# Patient Record
Sex: Male | Born: 1975 | Race: White | Hispanic: No | Marital: Married | Smoking: Never smoker
Health system: Southern US, Community
[De-identification: ages and names within clinical notes are randomized; demographics above are authoritative.]

## PROBLEM LIST (undated history)

## (undated) DIAGNOSIS — E663 Overweight: Secondary | ICD-10-CM

## (undated) DIAGNOSIS — E119 Type 2 diabetes mellitus without complications: Secondary | ICD-10-CM

## (undated) DIAGNOSIS — I10 Essential (primary) hypertension: Secondary | ICD-10-CM

## (undated) DIAGNOSIS — E291 Testicular hypofunction: Secondary | ICD-10-CM

## (undated) DIAGNOSIS — E785 Hyperlipidemia, unspecified: Secondary | ICD-10-CM

## (undated) HISTORY — DX: Testicular hypofunction: E29.1

## (undated) HISTORY — PX: APPENDECTOMY: SHX54

## (undated) HISTORY — PX: CHOLECYSTECTOMY: SHX55

## (undated) HISTORY — PX: KNEE SURGERY: SHX244

## (undated) HISTORY — DX: Essential (primary) hypertension: I10

## (undated) HISTORY — DX: Overweight: E66.3

## (undated) HISTORY — DX: Type 2 diabetes mellitus without complications: E11.9

## (undated) HISTORY — DX: Hyperlipidemia, unspecified: E78.5

## (undated) HISTORY — PX: TONSILECTOMY, ADENOIDECTOMY, BILATERAL MYRINGOTOMY AND TUBES: SHX2538

## (undated) HISTORY — PX: TONSILLECTOMY: SUR1361

---

## 2018-11-17 ENCOUNTER — Ambulatory Visit: Payer: 59

## 2018-11-17 ENCOUNTER — Other Ambulatory Visit: Payer: Self-pay

## 2018-11-17 DIAGNOSIS — Z01818 Encounter for other preprocedural examination: Secondary | ICD-10-CM

## 2018-11-17 LAB — POCT URINALYSIS DIPSTICK
Bilirubin, UA: NEGATIVE
Blood, UA: NEGATIVE
Glucose, UA: POSITIVE — AB
Ketones, UA: NEGATIVE
Leukocytes, UA: NEGATIVE
Nitrite, UA: NEGATIVE
Protein, UA: NEGATIVE
Spec Grav, UA: >=1.03 — AB (ref 1.010–1.025)
Urobilinogen, UA: 0.2 E.U./dL
pH, UA: 5.5 (ref 5.0–8.0)

## 2018-11-17 NOTE — Progress Notes (Signed)
Covid test 11/11/18 at [facility]) - Negative

## 2018-11-18 LAB — CMP12+LP+TP+TSH+6AC+PSA+CBC…
ALT: 44 IU/L (ref 0–44)
AST: 20 IU/L (ref 0–40)
Albumin/Globulin Ratio: 1.7 (ref 1.2–2.2)
Albumin: 4.3 g/dL (ref 4.0–5.0)
Alkaline Phosphatase: 103 IU/L (ref 39–117)
BUN/Creatinine Ratio: 9 (ref 9–20)
BUN: 8 mg/dL (ref 6–24)
Basophils Absolute: 0.1 10*3/uL (ref 0.0–0.2)
Basos: 1 %
Bilirubin Total: 0.3 mg/dL (ref 0.0–1.2)
Calcium: 9.5 mg/dL (ref 8.7–10.2)
Chloride: 99 mmol/L (ref 96–106)
Chol/HDL Ratio: 2.7 ratio (ref 0.0–5.0)
Cholesterol, Total: 118 mg/dL (ref 100–199)
Creatinine, Ser: 0.92 mg/dL (ref 0.76–1.27)
EOS (ABSOLUTE): 0.5 10*3/uL — ABNORMAL HIGH (ref 0.0–0.4)
Eos: 5 %
Estimated CHD Risk: <0.5 times avg. (ref 0.0–1.0)
Free Thyroxine Index: 1.8 (ref 1.2–4.9)
GFR calc Af Amer: 116 mL/min/{1.73_m2} (ref >59)
GFR calc non Af Amer: 100 mL/min/{1.73_m2} (ref >59)
GGT: 43 IU/L (ref 0–65)
Globulin, Total: 2.6 g/dL (ref 1.5–4.5)
Glucose: 220 mg/dL — ABNORMAL HIGH (ref 65–99)
HDL: 44 mg/dL (ref >39)
Hematocrit: 48.9 % (ref 37.5–51.0)
Hemoglobin: 16.4 g/dL (ref 13.0–17.7)
Immature Grans (Abs): 0.1 10*3/uL (ref 0.0–0.1)
Immature Granulocytes: 1 %
Iron: 49 ug/dL (ref 38–169)
LDH: 162 IU/L (ref 121–224)
LDL Chol Calc (NIH): 56 mg/dL (ref 0–99)
Lymphocytes Absolute: 2.7 10*3/uL (ref 0.7–3.1)
Lymphs: 27 %
MCH: 28.4 pg (ref 26.6–33.0)
MCHC: 33.5 g/dL (ref 31.5–35.7)
MCV: 85 fL (ref 79–97)
Monocytes Absolute: 0.7 10*3/uL (ref 0.1–0.9)
Monocytes: 7 %
Neutrophils Absolute: 6 10*3/uL (ref 1.4–7.0)
Neutrophils: 59 %
Phosphorus: 3.2 mg/dL (ref 2.8–4.1)
Platelets: 311 10*3/uL (ref 150–450)
Potassium: 4.1 mmol/L (ref 3.5–5.2)
Prostate Specific Ag, Serum: 0.6 ng/mL (ref 0.0–4.0)
RBC: 5.77 x10E6/uL (ref 4.14–5.80)
RDW: 13.3 % (ref 11.6–15.4)
Sodium: 136 mmol/L (ref 134–144)
T3 Uptake Ratio: 29 % (ref 24–39)
T4, Total: 6.3 ug/dL (ref 4.5–12.0)
TSH: 1.25 u[IU]/mL (ref 0.450–4.500)
Total Protein: 6.9 g/dL (ref 6.0–8.5)
Triglycerides: 94 mg/dL (ref 0–149)
Uric Acid: 3.9 mg/dL (ref 3.7–8.6)
VLDL Cholesterol Cal: 18 mg/dL (ref 5–40)
WBC: 10 10*3/uL (ref 3.4–10.8)

## 2018-11-18 LAB — MICROALBUMIN / CREATININE URINE RATIO
Creatinine, Urine: 117.9 mg/dL
Microalb/Creat Ratio: 7 mg/g creat (ref 0–29)
Microalbumin, Urine: 7.7 ug/mL

## 2018-11-18 LAB — HGB A1C W/O EAG: Hgb A1c MFr Bld: 10 % — ABNORMAL HIGH (ref 4.8–5.6)

## 2018-11-26 ENCOUNTER — Encounter: Payer: Self-pay | Admitting: Internal Medicine

## 2018-11-26 ENCOUNTER — Ambulatory Visit: Payer: Self-pay | Admitting: Internal Medicine

## 2018-11-26 ENCOUNTER — Other Ambulatory Visit: Payer: Self-pay

## 2018-11-26 VITALS — BP 138/90 | HR 112 | Temp 98.8°F | Resp 16 | Ht 69.0 in | Wt 196.0 lb

## 2018-11-26 DIAGNOSIS — E1169 Type 2 diabetes mellitus with other specified complication: Secondary | ICD-10-CM

## 2018-11-26 DIAGNOSIS — E7849 Other hyperlipidemia: Secondary | ICD-10-CM

## 2018-11-26 DIAGNOSIS — R030 Elevated blood-pressure reading, without diagnosis of hypertension: Secondary | ICD-10-CM | POA: Insufficient documentation

## 2018-11-26 DIAGNOSIS — E663 Overweight: Secondary | ICD-10-CM | POA: Insufficient documentation

## 2018-11-26 DIAGNOSIS — E291 Testicular hypofunction: Secondary | ICD-10-CM | POA: Insufficient documentation

## 2018-11-26 DIAGNOSIS — R7989 Other specified abnormal findings of blood chemistry: Secondary | ICD-10-CM | POA: Insufficient documentation

## 2018-11-26 DIAGNOSIS — E119 Type 2 diabetes mellitus without complications: Secondary | ICD-10-CM | POA: Insufficient documentation

## 2018-11-26 MED ORDER — SITAGLIPTIN PHOSPHATE 50 MG PO TABS: 50.0000 mg | ORAL_TABLET | Freq: Every day | ORAL | 1 refills | Status: DC

## 2018-11-26 NOTE — Progress Notes (Signed)
Had a Covid test at [place] ([place]) on 11/18/2018 & it was negative.  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  Says they are testing every week.

## 2018-11-26 NOTE — Progress Notes (Signed)
S  - 43 y.o male who presents for annual physical evaluation.  He noted he has not felt very good in past couple months, more fatigued, sometimes exhausted, peeing more, up at least three times a night to urinate, more thirsty, and knows it is because his sugars are up. On checks at home when not feeling well, is 350-400 usually, with the lowest he has been being 220. He checks only when not feeling well, not routinely.   He denies any recent CP, palpitations, SOB, abdominal pains, change in bowel habits, dark/black stools, vision changes, recent fevers, or other Covid concerning sx's, no numbness or tingling in ext's, no LE swelling, no joint swelling.   Exercise - no regular exercise, now that gym opening up, he and his wife plan to get back to some exercise and encouraged Diet - not adherent to a very healthy diet  Allergies  Allergen Reactions  . Zithromax [Azithromycin] Dermatitis     Meds reviewed Current Outpatient Medications on File Prior to Visit  Medication Sig Dispense Refill  . atorvastatin (LIPITOR) 10 MG tablet Take 10 mg by mouth daily.    Marland Kitchen glipiZIDE (GLUCOTROL) 10 MG tablet Take 10 mg by mouth 2 (two) times daily.    . metFORMIN (GLUCOPHAGE) 1000 MG tablet Take 1,000 mg by mouth 2 (two) times daily.     No current facility-administered medications on file prior to visit.    He noted only taking the glipizide once daily  No tob use  Alcohol use - rare, once every 6 months noted  FH - non-contrbutory  O - NAD, masked  BP 138/90 (BP Location: Right Arm, Patient Position: Sitting, Cuff Size: Large)   Pulse (!) 112   Temp 98.8 F (37.1 C) (Oral)   Resp 16   Ht 5\' 9"  (1.753 m)   Wt 196 lb (88.9 kg)   SpO2 98%   BMI 28.94 kg/m   HEENT - sclera anicteric, PERRL, EOMI, conj - non-inj'ed, No sinus tenderness, TM's and canals clear, pharynx clear Neck - supple, no adenopathy, no TM, carotids 2+ and = without bruits bilat Car - RRR without m/g/r, HR approx 90 on my  exam, not tachy Pulm- CTA without wheeze or rales Abd - soft, mildly obese, NT, ND, BS+, no obvious HSM, no masses Back - no CVA tenderness Skin- no new lesions of concern on exposed areas, denied otherwise, + tattoos on back and ext's noted Ext - no LE edema, no active joints GU - no swelling in inguinal/suprapubic region, NT,  Neuro - affect was not flat, appropriate with conversation  Grossly non-focal with good strength on testing, sensation intact to LT in distal extremities, Romberg neg, no pronator drift, good balance on one foot, good finger to nose, good RAMs  Labs reviewed - 3+ glucose in urine, no ketones, glc - 220, LDL 56, PSA - 0.6, urine microalb ok, A1C - 10 ECG reviewed - NSR, no concerning changes from prior ECG  Ass/Plan: 1. NIDDM - very poorly controlled  Continue current medications and to increase the glipizide to bid from once daily Reviewed the other classes of medicines with him to help, and added sitagliptin - 50mg  daily and noted likely will need to increase this to 100mg  daily on f/u visit for better control On statin with goal to keep LDL <70 Importance of diet modifications emphasized Importance of some regular aerobic exercise encouraged Above to help with weight control/weight loss also important  Referral to nutrition to help  and he agreed with this  Discussed potential next steps if not better controlled and why important to keep sugars very well controlled to help protect the heart, kidneys, and lessen future complications. Needs routine eye examinations and recommended following up for regular assessments and importance, and he noted they usually won't see him unless sugars better controlled and will try to get better control before seeing them May need endocrine help in near future if not able to get better control of sugars in the very near futrue   2. Hyperlipidemia  statin to continue Importance of diet modifications and some regular aerobic  exercise encouraged Above to help with weight control/weight loss also very important  Goal is LDL < 70 noted  3. Borderline HTN concern/high BP for diabetic - BP's on prior checks in the past have been good (124/72 last year, 138/78 last visit in March).   Will monitor and do feel the stresses of poor control of sugars can contribute.  Will have a very low yield to add an ACE inhibitor in the near future (not want to add too many meds in one visit today, but likely add next visit a good option if BP still higher than desired)  4. Hypogonadism history - stopped testosterone injections in Sept 2019 and last check of labs had the testosterone level of 215 in 11/2017. May be contributing to his fatigue.  Rec a recheck of testosterone level with next lab draw in the am and get a free test with that.   5. Increased BMI - overweight with weight 209 in 2018, lost weight to 192 a year ago, and 196 today.   Importance of diet modifications and regular aerobic exercise emphasized Appropriate weight loss goals discussed with lifestyle changes adopted for the long term very important for success and nutrition consulted to help - lifestyle referral center  To schedule a f/u in 4 weeks and will need recheck of labs then, a urine dip, glucose and A1C in the office at a minimum, with testosterone and free testosterone levels to be obtained with next blood draw. F/U sooner prn  I did inform the patient that his f/u will be with another provider as I will not be available to see patients in this clinic after the end of this week.

## 2018-11-26 NOTE — Patient Instructions (Signed)
Increase glipizide to two times daily  Referral to nutrition initiated to help with diet changes/recommendations

## 2018-11-27 ENCOUNTER — Other Ambulatory Visit: Payer: Self-pay

## 2018-11-27 MED ORDER — ACCU-CHEK COMPACT PLUS CARE KIT: PACK | 0 refills | Status: DC

## 2018-11-27 MED ORDER — ACCU-CHEK COMPACT PLUS VI STRP: ORAL_STRIP | 12 refills | Status: DC

## 2018-12-24 ENCOUNTER — Ambulatory Visit: Payer: Self-pay

## 2018-12-29 ENCOUNTER — Other Ambulatory Visit: Payer: Self-pay

## 2018-12-29 ENCOUNTER — Encounter: Payer: Self-pay | Admitting: Occupational Medicine

## 2018-12-29 ENCOUNTER — Ambulatory Visit: Payer: Self-pay | Admitting: Occupational Medicine

## 2018-12-29 VITALS — BP 140/80 | HR 80 | Temp 98.2°F | Resp 16 | Ht 71.0 in | Wt 201.0 lb

## 2018-12-29 DIAGNOSIS — E119 Type 2 diabetes mellitus without complications: Secondary | ICD-10-CM

## 2018-12-29 DIAGNOSIS — I10 Essential (primary) hypertension: Secondary | ICD-10-CM

## 2018-12-29 LAB — POCT URINALYSIS DIPSTICK
Bilirubin, UA: NEGATIVE
Blood, UA: NEGATIVE
Glucose, UA: NEGATIVE
Ketones, UA: NEGATIVE
Leukocytes, UA: NEGATIVE
Nitrite, UA: NEGATIVE
Protein, UA: NEGATIVE
Spec Grav, UA: >=1.03 — AB (ref 1.010–1.025)
Urobilinogen, UA: 0.2 E.U./dL
pH, UA: 5 (ref 5.0–8.0)

## 2018-12-29 LAB — POCT GLYCOSYLATED HEMOGLOBIN (HGB A1C)
HbA1c POC (<> result, manual entry): 7.6 % (ref 4.0–5.6)
Hemoglobin A1C: 7.6 % — AB (ref 4.0–5.6)

## 2018-12-29 LAB — GLUCOSE, POCT (MANUAL RESULT ENTRY): POC Glucose: 110 mg/dl — AB (ref 70–99)

## 2018-12-29 MED ORDER — LISINOPRIL 5 MG PO TABS: 5.0000 mg | ORAL_TABLET | Freq: Every day | ORAL | 0 refills | Status: DC

## 2018-12-29 MED ORDER — ACCU-CHEK MULTICLIX LANCET DEV KIT: PACK | 3 refills | Status: DC

## 2018-12-29 NOTE — Progress Notes (Signed)
Last Covid test at [facility] [place] on 12/23/2018 & it was negative. Works 2nd job at [employer] home.  Last visit in office was 11/26/2018 with  Dr. Roxan Hockey for annual physical 1.  Glipizide increased to bid. 2.  Added sitaglipitn 50 mg qd & planned to      Increase dose to 100 mg at 1 month       Follow-up visit. 3.  Check urinalysis, glucose & A1c at        1 month visit. 4.  Nutrition Referral - States appt       Scheduled at [facility] 01/07/19.  States glucose at home this morning was 114. Needs Rx for lancets.  AMD

## 2018-12-29 NOTE — Progress Notes (Addendum)
Patient ID: Eric Carr DOB: August 22, 1975 AGE: 43 y.o. MRN: [id number]   PCP: No primary care provider on file.   Chief Complaint:  Chief Complaint  Patient presents with  . Follow-up    1 month Follow-up form 11/26/2018 Physical  . Covid Screening    Negative - See Notes.     Subjective:    HPI:  Eric Carr is a 43 y.o. male presents for evaluation  Chief Complaint  Patient presents with  . Follow-up    1 month Follow-up form 11/26/2018 Physical  . Covid Screening    Negative - See Notes.   43 year old male returns to [place] clinic, one month s/p last visit, for re-evaluation of DM2.  Patient seen at [place] [facility] on 11/26/2018; reported fatigue, increased urinary frequency, nocturia (at least three times a night), and polydipsia.  Labs reviewed. NIDDM poorly controlled. On 11/17/18; AIC 10 and RBG 220 (BUN/CR ok at 8 and 0.92 respectively). BP 138/90.  Increased glipizide from qd to bid. Advised continuation of Metformin 1031m bid. Added Sitagliptin 516mqd, with plan to increased to 10081md today.  Patient was previously on testosterone injections; discontinued Sept 2019. May be contributing to fatigue.  Today, patient reports significant symptom improvement. Reports increased urinary frequency, polyuria, nocturia, and polydipsia has completely resolved. States fatigue feels the same; suspects is due to rotating shifts (does 2 weeks of days, then 2 weeks of nights). Has talked to supervisor about changing schedule.  Patient denies fever, chills, headache, dizziness/lightheadedness, nausea/vomiting, chest pain, SOB, palpitations, peripheral edema, diarrhea, skin changes.  A limited review of symptoms was performed, pertinent positives and negatives as mentioned in HPI.  The following portions of the patient's history were reviewed and updated as appropriate: allergies, current medications and past medical history.  Patient Active  Problem List   Diagnosis Date Noted  . [place] ([place]/30/2020  . Overweight (BMI 25.0-29.9) 11/26/2018  . Hypogonadism in male 11/26/2018  . Elevated BP without diagnosis of hypertension 11/26/2018  . Other hyperlipidemia 11/26/2018    Allergies  Allergen Reactions  . Zithromax [Azithromycin] Dermatitis    Current Outpatient Medications on File Prior to Visit  Medication Sig Dispense Refill  . atorvastatin (LIPITOR) 10 MG tablet Take 10 mg by mouth daily.    . Blood Glucose Monitoring Suppl (ACCU-CHEK COMPACT CARE KIT) KIT Finger stick blood sugar check bid 1 kit 0  . glipiZIDE (GLUCOTROL) 10 MG tablet Take 10 mg by mouth 2 (two) times daily.    . gMarland Kitchenucose blood (ACCU-CHEK COMPACT PLUS) test strip Use as instructed 100 each 12  . metFORMIN (GLUCOPHAGE) 1000 MG tablet Take 1,000 mg by mouth 2 (two) times daily.    . sitaGLIPtin (JANUVIA) 50 MG tablet Take 1 tablet (50 mg total) by mouth daily. 90 tablet 1   No current facility-administered medications on file prior to visit.        Objective:   Vitals:   12/29/18 0832  BP: 140/80  Pulse: 80  Resp: 16  Temp: 98.2 F (36.8 C)  SpO2: 98%     Wt Readings from Last 3 Encounters:  12/29/18 201 lb (91.2 kg)  11/26/18 196 lb (88.9 kg)    Physical Exam:   General Appearance:  Patient sitting comfortably on examination table. Conversational. GooKermit Balolf-historian. In no acute distress. Afebrile.   Head:  Normocephalic, without obvious abnormality, atraumatic  Eyes:  PERRL, conjunctiva/corneas clear, EOM's intact  Neck: Supple, symmetrical, trachea midline, no adenopathy  Lungs:   Clear to auscultation bilaterally, respirations unlabored. Good aeration. No rales, rhonchi, crackles or wheezing.  Heart:  Regular rate and rhythm, S1 and S2 normal, no murmur, rub, or gallop  Abdomen:   Normal to inspection. Normoactive bowel sounds. No tenderness with palpation. No guarding, rigidity or rebound tenderness. No palpable  organomegaly.  Extremities: Extremities normal, atraumatic, no cyanosis or edema  Pulses: 2+ and symmetric  Skin: Skin color, texture, turgor normal, no rashes or lesions  Lymph nodes: Cervical, supraclavicular, and axillary nodes normal  Neurologic: Normal    Assessment & Plan:    Exam findings, diagnosis etiology and medication use and indications reviewed with patient. Follow-Up and discharge instructions provided. No emergent/urgent issues found on exam.  Patient education was provided.   Patient verbalized understanding of information provided and agrees with plan of care (POC), all questions answered. The patient is advised to call or return to clinic if condition does not see an improvement in symptoms, or to seek the care of the closest emergency department if condition worsens with the below plan.   Orders Placed This Encounter  Procedures  . POCT urinalysis dipstick  . POCT glycosylated hemoglobin (Hb A1C)  . POCT glucose (manual entry)    Results for orders placed or performed in visit on 12/29/18  POCT urinalysis dipstick  Result Value Ref Range   Color, UA Dark Yellow    Clarity, UA Clear    Glucose, UA Negative Negative   Bilirubin, UA Negative    Ketones, UA Negative    Spec Grav, UA >=1.030 (A) 1.010 - 1.025   Blood, UA Negative    pH, UA 5.0 5.0 - 8.0   Protein, UA Negative Negative   Urobilinogen, UA 0.2 0.2 or 1.0 E.U./dL   Nitrite, UA Negative    Leukocytes, UA Negative Negative   Appearance     Odor    POCT glycosylated hemoglobin (Hb A1C)  Result Value Ref Range   Hemoglobin A1C 7.6 (A) 4.0 - 5.6 %   HbA1c POC (<> result, manual entry) 7.6 4.0 - 5.6 %   HbA1c, POC (prediabetic range)     HbA1c, POC (controlled diabetic range)    POCT glucose (manual entry)  Result Value Ref Range   POC Glucose 110 (A) 70 - 99 mg/dl    1. Type 2 diabetes mellitus without complication, without long-term current use of insulin (HCC) - POCT urinalysis dipstick -  POCT glycosylated hemoglobin (Hb A1C) - POCT glucose (manual entry) - Lancets Misc. (ACCU-CHEK MULTICLIX LANCET DEV) KIT; Please provide patient with one box of lancets that match his Accu-Check glucometer, with 3 refills  Dispense: 1 kit; Refill: 3  2. Hypertension, unspecified type - lisinopril (ZESTRIL) 5 MG tablet; Take 1 tablet (5 mg total) by mouth at bedtime.  Dispense: 90 tablet; Refill: 0  Patient presented today for 1 month f/u in regards to worsening/uncontrolled and symptomatic DM2 (non-insulin dependent). Glipizide increased from 78m qd to 174mbid. Patient also started Sitagliptin (Januvia) 5033md. Plan was to increased Sitagliptin from 35m33m 100mg35mwever, patient's A1C went from 10 to 7.6 within one month. UA now with no glycosuria (still no ketones or protein). And, patient reports symptom resolution.  Plan is to have patient continue current DM2 medication regimen. Provided refill of lancets. Due to 8 year history of DM2 (diagnosed in 2012 with initial A1C of 13) and borderline hypertension, prescribed Lisinopril 5mg q56m   Patient will f/u in 2-3 months. At that  time, will repeat blood work (including BMP, A1C, testosterone and free testosterone).  Patient agrees with plan.   Darlin Priestly, MHS, PA-C Montey Hora, MHS, PA-C Advanced Practice Provider [place] [email or web address]

## 2019-01-07 ENCOUNTER — Other Ambulatory Visit: Payer: Self-pay

## 2019-01-07 ENCOUNTER — Encounter: Payer: Self-pay | Admitting: Dietician

## 2019-01-07 ENCOUNTER — Encounter: Payer: 59 | Attending: Internal Medicine | Admitting: Dietician

## 2019-01-07 VITALS — Ht 69.0 in | Wt 198.1 lb

## 2019-01-07 DIAGNOSIS — E119 Type 2 diabetes mellitus without complications: Secondary | ICD-10-CM

## 2019-01-07 DIAGNOSIS — Z6829 Body mass index (BMI) 29.0-29.9, adult: Secondary | ICD-10-CM | POA: Diagnosis not present

## 2019-01-07 DIAGNOSIS — Z713 Dietary counseling and surveillance: Secondary | ICD-10-CM | POA: Diagnosis not present

## 2019-01-07 DIAGNOSIS — E1165 Type 2 diabetes mellitus with hyperglycemia: Secondary | ICD-10-CM | POA: Diagnosis not present

## 2019-01-07 DIAGNOSIS — E663 Overweight: Secondary | ICD-10-CM

## 2019-01-07 NOTE — Progress Notes (Signed)
Medical Nutrition Therapy: Visit start time: 0900  end time: 1010  Assessment:  Diagnosis: Type 2 diabetes, overweight Past medical history: HTN Psychosocial issues/ stress concerns: none  Preferred learning method:  . Auditory . Hands-on   Current weight: 198.1lbs Height: 5'11" Medications, supplements: reconciled list in medical record  Progress and evaluation:   Patient reports recent improvement in BG control; 115-135 fasting BGs. Tests once daily. He states BGs were 400-500 prior to starting Januvia.  He does not tolerate artificial sweeteners, gets headaches from aspartame, saccharin, and sucralose.   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   Denies any recent diet changes. Denies any previous diabetes education.   Physical activity: no structured exercise; works active job for 12-hour shifts, alternating days and nights every 2 weeks  Dietary Intake:  Usual eating pattern includes 1-2 meals and 0 snacks per day. Dining out frequency: 3-6 meals per week.  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 --  Snack: none Beverages: sweet or unsweetened tea; water; some soda (diet sodas = headache)  Nutrition Care Education: Topics covered:  Basic nutrition: basic food groups, appropriate nutrient balance, appropriate meal and snack schedule, general nutrition guidelines    Weight control:determining reasonable weight loss rate, importance of low sugar and low fat choices, portion control strategies including increasing low-carb vegetables to help with  satiety and planning for more frequent meals rather than 1-2 large meals, estimated energy needs for weight loss at 1600-1700kcal, provided guidance for 45%CHO,  Advanced nutrition:  recipe modification, cooking techniques, dining out, food label reading Diabetes:  goals for BGs, appropriate meal and snack schedule, appropriate carb intake and balance, healthy carb choices, role of fiber, protein, fat; sample balanced meals Hypertension: identifying high sodium foods, identifying food sources of potassium, magnesium Hyperlipidemia:  role of fiber, limiting excess fat/ saturated fat   Nutritional Diagnosis:  [place]-2.2 Altered nutrition-related laboratory As related to Type 2 diabetes.  As evidenced by patient with recent HbA1C of 10%. [place]-3.3 Overweight/obesity As related to excess calories.  As evidenced by patient with current BMI of 29.25.  Intervention:   Instruction and discussion as noted above.  Established nutrition goals with direction from patient.   Patient voices readiness to work on dietary improvement for his health.   Education Materials given:  . General diet guidelines for Diabetes . Plate Planner with food lists, sample meal pattern . Sample menus . Goals/ instructions   Learner/ who was taught:  . Patient   Level of understanding: Marland Kitchen Verbalizes/ demonstrates competency  Demonstrated degree of understanding via:   Teach back Learning barriers: . None  Willingness to learn/ readiness for change: . Acceptance, ready for change   Monitoring and Evaluation:  Dietary intake, exercise, BG control, and body weight      follow up: 02/13/19 at 9:00am

## 2019-01-07 NOTE — Patient Instructions (Addendum)
   Reduce the amount of soda each day, remember every 4oz is another serving of carb (about 15grams) and 60 calories.   Control the amount of carb within each meal to 45-60grams. Decrease portions as needed to control intake and keep blood sugar steady. Having a protein food and a generous portion of vegetables helps with feeling full.   Plan to eat something every 3-5 hours while awake, especially when active. Consider adding a light meal or a snack during night shift.

## 2019-02-13 ENCOUNTER — Ambulatory Visit: Payer: 59 | Admitting: Dietician

## 2019-03-02 DIAGNOSIS — Z03818 Encounter for observation for suspected exposure to other biological agents ruled out: Secondary | ICD-10-CM | POA: Diagnosis not present

## 2019-03-19 ENCOUNTER — Encounter: Payer: Self-pay | Admitting: Dietician

## 2019-03-19 NOTE — Progress Notes (Signed)
Have not heard back from patient to reschedule his missed appointment from 02/13/19. Sent notification to referring provider.

## 2019-04-02 ENCOUNTER — Other Ambulatory Visit: Payer: Self-pay

## 2019-04-02 DIAGNOSIS — I10 Essential (primary) hypertension: Secondary | ICD-10-CM

## 2019-04-02 DIAGNOSIS — E291 Testicular hypofunction: Secondary | ICD-10-CM

## 2019-04-02 NOTE — Progress Notes (Signed)
Patient comes in today for 3 month follow up labs.

## 2019-04-05 LAB — BASIC METABOLIC PANEL
BUN/Creatinine Ratio: 14 (ref 9–20)
BUN: 12 mg/dL (ref 6–24)
CO2: 19 mmol/L — ABNORMAL LOW (ref 20–29)
Calcium: 8.7 mg/dL (ref 8.7–10.2)
Chloride: 105 mmol/L (ref 96–106)
Creatinine, Ser: 0.87 mg/dL (ref 0.76–1.27)
GFR calc Af Amer: 120 mL/min/{1.73_m2} (ref >59)
GFR calc non Af Amer: 104 mL/min/{1.73_m2} (ref >59)
Glucose: 185 mg/dL — ABNORMAL HIGH (ref 65–99)
Potassium: 4.2 mmol/L (ref 3.5–5.2)
Sodium: 138 mmol/L (ref 134–144)

## 2019-04-05 LAB — TESTOSTERONE,FREE AND TOTAL
Testosterone, Free: 9.1 pg/mL (ref 6.8–21.5)
Testosterone: 240 ng/dL — ABNORMAL LOW (ref 264–916)

## 2019-04-05 LAB — HGB A1C W/O EAG: Hgb A1c MFr Bld: 10 % — ABNORMAL HIGH (ref 4.8–5.6)

## 2019-04-09 ENCOUNTER — Ambulatory Visit: Payer: Self-pay

## 2019-04-23 ENCOUNTER — Other Ambulatory Visit: Payer: Self-pay

## 2019-04-23 ENCOUNTER — Ambulatory Visit: Payer: Self-pay | Admitting: Physician Assistant

## 2019-04-23 VITALS — BP 138/80 | HR 92 | Temp 98.2°F | Ht 69.0 in | Wt 199.6 lb

## 2019-04-23 DIAGNOSIS — E119 Type 2 diabetes mellitus without complications: Secondary | ICD-10-CM

## 2019-04-23 NOTE — Progress Notes (Signed)
One month follow up for DM2, so now 2 months from increased intervention   States he is following a diet and taking meds as directed He has not lost any weight  Expresses frustration that AM FBS, when he does check  is frequently around 300  Went to one Diabetic education session at [facility] and expresses dissatisfaction with the interaction, so did not return for f/u visit  11/17/18  Recent A1C     10.0   After new start medications:  12/29/18   A1C   7.6  States that he is taking medications as directed: 04/01/18      A1C 10                   Today 10  Meds in computer reviewed with patient: Metformin 1000 mg BID Glipizide   10 mg daily BID Sitagliptin/Januvia 50 mg 1 daily ( did not increase to BID)  Aorvastatin 10 mg daily Lisinopril 5 mg  1 QHS  Issues of nocturia , polyuria , polydipsia are generally improved but have returned slightly with decreased control Denies fever or chlls, faint/lightheaded/dizzy  Denies ever being told that he was told to  exercise daily  Impression : DM2   Poor control  Plan : Extended consultation with patient regarding All of above issues. He experiences frustration with elevated FBS levels -"the medications aren't working". Discussed the ability to "out eat" the control levels of meds Has not yet taken ownership of the chronicity of this diagnosis and is coached to reconsider returning to diabetic /nutrition  counseling. Walk 30 minutes per day, briskly and increase time whenever possible. Given handouts and encouraged to read and take portion measurements and  Food choices seriously. Supported and encouraged.  and he will see improvements over time. Will try to see regularly To support. Rec he increase Januvia to BID as above  See in one month for DM review, wt, BP, A1C

## 2019-04-23 NOTE — Patient Instructions (Signed)

## 2019-05-18 ENCOUNTER — Ambulatory Visit: Payer: Self-pay

## 2019-07-21 ENCOUNTER — Other Ambulatory Visit: Payer: Self-pay

## 2019-07-21 ENCOUNTER — Ambulatory Visit: Payer: 59

## 2019-07-21 ENCOUNTER — Encounter: Payer: Self-pay | Admitting: Emergency Medicine

## 2019-07-21 ENCOUNTER — Ambulatory Visit: Payer: Self-pay | Admitting: Emergency Medicine

## 2019-07-21 VITALS — BP 142/93 | HR 80 | Temp 97.6°F | Resp 12 | Ht 69.0 in | Wt 193.0 lb

## 2019-07-21 DIAGNOSIS — M25512 Pain in left shoulder: Secondary | ICD-10-CM

## 2019-07-21 NOTE — Addendum Note (Signed)
Addended by: Gardner Candle on: 07/21/2019 11:54 AM   Modules accepted: Orders

## 2019-07-21 NOTE — Progress Notes (Signed)
No Known Injury  Shoulder pain x couple of months. Intermittent Gradually getting worse Hurts with certain movements & positions. Feels like a sharp stabbing pain.  Has taken [facility] powder & doesn't get much relief with it. Has tried Unker's ointment - no relief.  AMD

## 2019-07-21 NOTE — Progress Notes (Signed)
  [facility] Occupational Health Provider Note       Time seen: 11:05 AM    I have reviewed the vital signs and the nursing notes.  HISTORY   Chief Complaint Shoulder Pain (Left)    HPI Luchiano Viscomi is a 44 y.o. male with a history of diabetes who presents today for left shoulder pain.  Patient's had ongoing pain for several months that is gradually gotten worse.  Range of motion is limited in the left shoulder because of pain.  Has taken BC powder but does not get significant relief with it.  He has not had any specific injury that he remembers.  Past Medical History:  Diagnosis Date  . Diabetes (HCC)   . Hypogonadism male   . Overweight     Past Surgical History:  Procedure Laterality Date  . APPENDECTOMY    . CHOLECYSTECTOMY    . KNEE SURGERY Left   . TONSILECTOMY, ADENOIDECTOMY, BILATERAL MYRINGOTOMY AND TUBES Bilateral     Allergies Zithromax [azithromycin]  Review of Systems Constitutional: Negative for fever. Musculoskeletal: Positive for left shoulder pain and decreased range of motion Skin: Negative for rash. Neurological: Negative for headaches, focal weakness or numbness.  All systems negative/normal/unremarkable except as stated in the HPI  ____________________________________________   PHYSICAL EXAM:  VITAL SIGNS: Vitals:   07/21/19 1053  BP: (!) 142/93  Pulse: 80  Resp: 12  Temp: 97.6 F (36.4 C)  SpO2: 98%    Constitutional: Alert and oriented. Well appearing and in no distress. Eyes: Conjunctivae are normal. Normal extraocular movements. Musculoskeletal: There is pain with range of motion of left shoulder.  Particular pain in the left shoulder with both external and internal rotation.  Findings concerning for rotator cuff injury.  He has severely limited range of motion with internal rotation and abduction.  External rotation is limited as well. Neurologic:  Normal speech and language. No gross focal neurologic deficits are  appreciated.  Skin:  Skin is warm, dry and intact. No rash noted. Psychiatric: Speech and behavior are normal.   DIFFERENTIAL DIAGNOSIS  Rotator cuff injury, arthritis, bursitis  ASSESSMENT AND PLAN  Left shoulder pain   Plan: The patient had presented for left shoulder pain mostly resembling rotator cuff injury.  He is encouraged to use anti-inflammatory medicine.  Light duty, no heavy lifting for the next week and follow-up with orthopedics for MRI of his left shoulder.  Daryel November MD    Note: This note was generated in part or whole with voice recognition software. Voice recognition is usually quite accurate but there are transcription errors that can and very often do occur. I apologize for any typographical errors that were not detected and corrected.

## 2019-07-31 DIAGNOSIS — M7540 Impingement syndrome of unspecified shoulder: Secondary | ICD-10-CM | POA: Insufficient documentation

## 2019-08-13 ENCOUNTER — Other Ambulatory Visit: Payer: Self-pay

## 2019-08-13 ENCOUNTER — Encounter: Payer: Self-pay | Admitting: Physician Assistant

## 2019-08-13 ENCOUNTER — Ambulatory Visit: Payer: Self-pay | Admitting: Physician Assistant

## 2019-08-13 VITALS — BP 148/86 | HR 101 | Temp 97.5°F | Resp 12 | Ht 69.0 in | Wt 193.0 lb

## 2019-08-13 DIAGNOSIS — E1169 Type 2 diabetes mellitus with other specified complication: Secondary | ICD-10-CM

## 2019-08-13 LAB — POCT GLYCOSYLATED HEMOGLOBIN (HGB A1C): Hemoglobin A1C: 11.3 % — AB (ref 4.0–5.6)

## 2019-08-13 MED ORDER — GLIPIZIDE 10 MG PO TABS: 10.0000 mg | ORAL_TABLET | Freq: Two times a day (BID) | ORAL | 3 refills | Status: DC

## 2019-08-13 MED ORDER — LISINOPRIL 10 MG PO TABS: 10.0000 mg | ORAL_TABLET | Freq: Every day | ORAL | 3 refills | Status: DC

## 2019-08-13 MED ORDER — ATORVASTATIN CALCIUM 10 MG PO TABS: 10.0000 mg | ORAL_TABLET | Freq: Every day | ORAL | 3 refills | Status: DC

## 2019-08-13 MED ORDER — METFORMIN HCL 1000 MG PO TABS: 1000.0000 mg | ORAL_TABLET | Freq: Two times a day (BID) | ORAL | 3 refills | Status: DC

## 2019-08-13 MED ORDER — SITAGLIPTIN PHOSPHATE 50 MG PO TABS: 50.0000 mg | ORAL_TABLET | Freq: Every day | ORAL | 3 refills | Status: DC

## 2019-08-13 MED ORDER — SITAGLIPTIN PHOSPHATE 50 MG PO TABS: 50.0000 mg | ORAL_TABLET | Freq: Every day | ORAL | 1 refills | Status: DC

## 2019-08-13 NOTE — Progress Notes (Signed)
Last DM visit was 04/23/19 with Earvin Hansen, PA-C. Last physical was 11/26/2018 with  Dr. Dorris Fetch.  Is taking Meloxicam & Flexeril prescribed by [facility] for Left Shoulder bone spurs.  States Rx's expired 3 wks ago & hasn't called for refills.  Needs Rs refills for: Atovastain Metformin Lisinopril Glipizide  Januvia  AMD

## 2019-08-13 NOTE — Progress Notes (Signed)
   Subjective: Diabetes    Patient ID: Eric Carr, male    DOB: 11-22-1975, 44 y.o.   MRN: [id number]  HPI Patient presents for reevaluation of diabetes.  Patient has been out of medication for 3 months.   Review of Systems Diabetes, hyperlipidemia, and hypertension.    Objective:   Physical Exam No acute distress.  HEENT is unremarkable.  Neck is supple without adenopathy or bruits.  Lungs are clear to auscultation.  Heart regular rate and rhythm.  Abdomen negative HSM, normoactive bowel sound, soft, and nontender to palpation.       Assessment & Plan: Diabetes  Patient diabetes poor control.  Hemoglobin A1c today was 11.3.  4 months ago was 10.0.  Patient advised to restart medications and to keep a diabetic log and follow-up in 1 month.  All medications were refilled today.

## 2019-09-10 ENCOUNTER — Ambulatory Visit: Payer: Self-pay | Admitting: Physician Assistant

## 2019-09-10 ENCOUNTER — Other Ambulatory Visit: Payer: Self-pay

## 2019-09-10 ENCOUNTER — Encounter: Payer: Self-pay | Admitting: Physician Assistant

## 2019-09-10 VITALS — BP 136/86 | HR 80 | Temp 97.5°F | Resp 14 | Ht 69.0 in | Wt 194.0 lb

## 2019-09-10 DIAGNOSIS — E1169 Type 2 diabetes mellitus with other specified complication: Secondary | ICD-10-CM

## 2019-09-10 NOTE — Progress Notes (Signed)
   Subjective: Diabetes    Patient ID: Eric Carr, male    DOB: [birth year], 44 y.o.   MRN: [id number]  HPI Patient presents for 1 month follow-up secondary to poor control diabetes.  Patient is currently taking Glucotrol, Metformin, and Januvia.  Patient is keeping a log and showed an average of glucose of 350.  Patient states due to working shifts he did not always take the medicine on a routine basis.  Patient continues to have polydipsia, nocturia, and fatigue. Review of Systems Diabetes, hyperlipidemia, and hypertension.    Objective:   Physical Exam No acute distress.  Exam is deferred.  Unable to get a hemoglobin A1c by fingerstick due to unit not working properly.       Assessment & Plan: Diabetes  Patient diabetes still poorly controlled.  We will get a hemoglobin A1c and will have the patient consulted to endocrinology.

## 2019-09-11 LAB — HGB A1C W/O EAG: Hgb A1c MFr Bld: 11.3 % — ABNORMAL HIGH (ref 4.8–5.6)

## 2019-09-17 NOTE — Addendum Note (Signed)
Addended by: Gardner Candle on: 09/17/2019 01:15 PM   Modules accepted: Orders

## 2019-09-30 ENCOUNTER — Other Ambulatory Visit: Payer: Self-pay

## 2019-09-30 DIAGNOSIS — Z20822 Contact with and (suspected) exposure to covid-19: Secondary | ICD-10-CM

## 2019-09-30 NOTE — Progress Notes (Signed)
Presents for covid screen.  S/Sx started yesterday: Headache  Bodyaches Fever of around 100 F Head congestion  Denies loss of taste or smell.  AMD

## 2019-10-02 LAB — NOVEL CORONAVIRUS, NAA: SARS-CoV-2, NAA: DETECTED — AB

## 2019-10-02 LAB — SARS-COV-2, NAA 2 DAY TAT

## 2019-10-30 ENCOUNTER — Other Ambulatory Visit: Payer: Self-pay

## 2019-10-30 ENCOUNTER — Emergency Department
Admission: EM | Admit: 2019-10-30 | Discharge: 2019-10-30 | Disposition: A | Discharge status: Home / Self Care | Payer: 59 | Attending: Emergency Medicine | Admitting: Emergency Medicine

## 2019-10-30 DIAGNOSIS — Z79899 Other long term (current) drug therapy: Secondary | ICD-10-CM | POA: Diagnosis not present

## 2019-10-30 DIAGNOSIS — Y999 Unspecified external cause status: Secondary | ICD-10-CM | POA: Diagnosis not present

## 2019-10-30 DIAGNOSIS — Y92410 Unspecified street and highway as the place of occurrence of the external cause: Secondary | ICD-10-CM | POA: Insufficient documentation

## 2019-10-30 DIAGNOSIS — M545 Low back pain: Secondary | ICD-10-CM | POA: Diagnosis not present

## 2019-10-30 DIAGNOSIS — M7918 Myalgia, other site: Secondary | ICD-10-CM

## 2019-10-30 DIAGNOSIS — Z7984 Long term (current) use of oral hypoglycemic drugs: Secondary | ICD-10-CM | POA: Insufficient documentation

## 2019-10-30 DIAGNOSIS — Y939 Activity, unspecified: Secondary | ICD-10-CM | POA: Diagnosis not present

## 2019-10-30 DIAGNOSIS — E119 Type 2 diabetes mellitus without complications: Secondary | ICD-10-CM | POA: Diagnosis not present

## 2019-10-30 DIAGNOSIS — M25512 Pain in left shoulder: Secondary | ICD-10-CM | POA: Insufficient documentation
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
## 2020-02-12 ENCOUNTER — Other Ambulatory Visit: Payer: Self-pay | Admitting: Emergency Medicine

## 2020-02-12 ENCOUNTER — Other Ambulatory Visit: Payer: Self-pay

## 2020-02-12 ENCOUNTER — Emergency Department
Admission: EM | Admit: 2020-02-12 | Discharge: 2020-02-12 | Disposition: A | Discharge status: Home / Self Care | Payer: 59 | Attending: Emergency Medicine | Admitting: Emergency Medicine

## 2020-02-12 ENCOUNTER — Emergency Department: Payer: 59

## 2020-02-12 ENCOUNTER — Encounter: Payer: Self-pay | Admitting: Emergency Medicine

## 2020-02-12 DIAGNOSIS — Z7984 Long term (current) use of oral hypoglycemic drugs: Secondary | ICD-10-CM | POA: Insufficient documentation

## 2020-02-12 DIAGNOSIS — R0781 Pleurodynia: Secondary | ICD-10-CM | POA: Diagnosis not present

## 2020-02-12 DIAGNOSIS — R059 Cough, unspecified: Secondary | ICD-10-CM | POA: Diagnosis not present

## 2020-02-12 DIAGNOSIS — E119 Type 2 diabetes mellitus without complications: Secondary | ICD-10-CM | POA: Insufficient documentation

## 2020-02-12 DIAGNOSIS — Z8719 Personal history of other diseases of the digestive system: Secondary | ICD-10-CM

## 2020-02-12 DIAGNOSIS — R072 Precordial pain: Secondary | ICD-10-CM | POA: Diagnosis present

## 2020-02-12 DIAGNOSIS — R0789 Other chest pain: Secondary | ICD-10-CM | POA: Diagnosis not present

## 2020-02-12 LAB — CBC
HCT: 45.8 % (ref 39.0–52.0)
Hemoglobin: 16.1 g/dL (ref 13.0–17.0)
MCH: 28.9 pg (ref 26.0–34.0)
MCHC: 35.2 g/dL (ref 30.0–36.0)
MCV: 82.1 fL (ref 80.0–100.0)
Platelets: 271 10*3/uL (ref 150–400)
RBC: 5.58 MIL/uL (ref 4.22–5.81)
RDW: 12.7 % (ref 11.5–15.5)
WBC: 10.5 10*3/uL (ref 4.0–10.5)
nRBC: 0 % (ref 0.0–0.2)

## 2020-02-12 LAB — BASIC METABOLIC PANEL
Anion gap: 11 (ref 5–15)
BUN: 13 mg/dL (ref 6–20)
CO2: 22 mmol/L (ref 22–32)
Calcium: 9 mg/dL (ref 8.9–10.3)
Chloride: 104 mmol/L (ref 98–111)
Creatinine, Ser: 0.82 mg/dL (ref 0.61–1.24)
GFR, Estimated: >60 mL/min (ref >60)
Glucose, Bld: 267 mg/dL — ABNORMAL HIGH (ref 70–99)
Potassium: 4 mmol/L (ref 3.5–5.1)
Sodium: 137 mmol/L (ref 135–145)

## 2020-02-12 LAB — TROPONIN I (HIGH SENSITIVITY): Troponin I (High Sensitivity): 3 ng/L (ref <18)

## 2020-02-12 MED ORDER — FAMOTIDINE 40 MG PO TABS: 40.0000 mg | ORAL_TABLET | Freq: Every evening | ORAL | 1 refills | Status: DC

## 2020-02-12 NOTE — ED Provider Notes (Signed)
[facility] Emergency Department Provider Note   ____________________________________________   Event Date/Time   First MD Initiated Contact with Patient 02/12/20 1156     (approximate)  I have reviewed the triage vital signs and the nursing notes.   HISTORY  Chief Complaint Chest Pain    HPI Eric Carr is a 44 y.o. male stated past medical history of diabetes and GERD who presents for upper sternal burning chest pain that began this morning.  Patient states that this pain is burning, upper central substernal region, 6/10, nonradiating, and worse with deep inspiration.  Patient denies any relieving factors.  Patient states that he has had similar pains in the past but it has never been this severe or lasted this long.  Patient denies using any medications for his reflux and states that he commonly wakes up early in the morning with a sore throat and metallic taste.  Patient currently denies any vision changes, tinnitus, difficulty speaking, facial droop, sore throat, shortness of breath, abdominal pain, nausea/vomiting/diarrhea, dysuria, or weakness/numbness/paresthesias in any extremity         Past Medical History:  Diagnosis Date  . Diabetes ([place])   . Hypogonadism male   . Overweight     Patient Active Problem List   Diagnosis Date Noted  . Diabetes ([name]) 11/26/2018  . Overweight (BMI 25.0-29.9) 11/26/2018  . Hypogonadism in male 11/26/2018  . Elevated BP without diagnosis of hypertension 11/26/2018  . Other hyperlipidemia 11/26/2018    Past Surgical History:  Procedure Laterality Date  . APPENDECTOMY    . CHOLECYSTECTOMY    . KNEE SURGERY Left   . TONSILECTOMY, ADENOIDECTOMY, BILATERAL MYRINGOTOMY AND TUBES Bilateral     Prior to Admission medications   Medication Sig Start Date End Date Taking? Authorizing Provider  Accu-Chek FastClix Lancets MISC USE 1 TO CHECK GLUCOSE ONCE DAILY 12/29/18   [provider]  atorvastatin  (LIPITOR) 10 MG tablet Take 1 tablet (10 mg total) by mouth daily. 08/13/19   Sable Feil, PA-C  Blood Glucose Monitoring Suppl (ACCU-CHEK COMPACT CARE KIT) KIT Finger stick blood sugar check bid 11/27/18   Towanda Malkin, MD  cyclobenzaprine (FLEXERIL) 10 MG tablet Take 10 mg by mouth at bedtime. 07/31/19   [provider]  famotidine (PEPCID) 40 MG tablet Take 1 tablet (40 mg total) by mouth every evening. 02/12/20 02/11/21  Lavonia Drafts, MD  glipiZIDE (GLUCOTROL) 10 MG tablet Take 1 tablet (10 mg total) by mouth 2 (two) times daily before a meal. 08/13/19   Sable Feil, PA-C  glipiZIDE (GLUCOTROL) 10 MG tablet Take 1 tablet (10 mg total) by mouth 2 (two) times daily. 08/13/19   Sable Feil, PA-C  glucose blood (ACCU-CHEK COMPACT PLUS) test strip Use as instructed 11/27/18   Towanda Malkin, MD  lisinopril (ZESTRIL) 10 MG tablet Take 1 tablet (10 mg total) by mouth daily. 08/13/19   Sable Feil, PA-C  Melatonin 10 MG TABS Take by mouth.    [provider]  meloxicam (MOBIC) 15 MG tablet Take 15 mg by mouth daily. 07/31/19   [provider]  metFORMIN (GLUCOPHAGE) 1000 MG tablet Take 1 tablet (1,000 mg total) by mouth 2 (two) times daily. 08/13/19   Sable Feil, PA-C  sitaGLIPtin (JANUVIA) 50 MG tablet Take 1 tablet (50 mg total) by mouth daily. Patient taking differently: Take 50 mg by mouth daily. Twice a day 08/13/19   Sable Feil, PA-C  sitaGLIPtin [facility])  50 MG tablet Take 1 tablet (50 mg total) by mouth daily. 08/13/19   Sable Feil, PA-C    Allergies Zithromax [azithromycin]  History reviewed. No pertinent family history.  Social History Social History   Tobacco Use  . Smoking status: Never Smoker  . Smokeless tobacco: Never Used  Substance Use Topics  . Alcohol use: Not Currently    Comment: 1-2 drinks per year on average    Review of Systems Constitutional: No fever/chills Eyes: No visual changes. ENT: No sore  throat. Cardiovascular: Endorses chest pain. Respiratory: Denies shortness of breath. Gastrointestinal: No abdominal pain.  No nausea, no vomiting.  No diarrhea. Genitourinary: Negative for dysuria. Musculoskeletal: Negative for acute arthralgias Skin: Negative for rash. Neurological: Negative for headaches, weakness/numbness/paresthesias in any extremity Psychiatric: Negative for suicidal ideation/homicidal ideation   ____________________________________________   PHYSICAL EXAM:  VITAL SIGNS: ED Triage Vitals  Enc Vitals Group     BP 02/12/20 0913 138/86     Pulse Rate 02/12/20 0913 87     Resp 02/12/20 0913 20     Temp 02/12/20 0913 98.6 F (37 C)     Temp Source 02/12/20 0913 Oral     SpO2 02/12/20 0913 97 %     Weight 02/12/20 0915 188 lb (85.3 kg)     Height 02/12/20 0915 '5\' 9"'  (1.753 m)     Head Circumference --      Peak Flow --      Pain Score 02/12/20 0915 4     Pain Loc --      Pain Edu? --      Excl. in [place]? --    Constitutional: Alert and oriented. Well appearing and in no acute distress. Eyes: Conjunctivae are normal. PERRL. Head: Atraumatic. Nose: No congestion/rhinnorhea. Mouth/Throat: Mucous membranes are moist. Neck: No stridor Cardiovascular: Grossly normal heart sounds.  Good peripheral circulation. Respiratory: Normal respiratory effort.  No retractions. Gastrointestinal: Soft and nontender. No distention. Musculoskeletal: No obvious deformities Neurologic:  Normal speech and language. No gross focal neurologic deficits are appreciated. Skin:  Skin is warm and dry. No rash noted. Psychiatric: Mood and affect are normal. Speech and behavior are normal.  ____________________________________________   LABS (all labs ordered are listed, but only abnormal results are displayed)  Labs Reviewed  BASIC METABOLIC PANEL - Abnormal; Notable for the following components:      Result Value   Glucose, Bld 267 (*)    All other components within normal  limits  CBC  TROPONIN I (HIGH SENSITIVITY)  TROPONIN I (HIGH SENSITIVITY)   ____________________________________________  EKG  ED ECG REPORT I, Naaman Plummer, the attending physician, personally viewed and interpreted this ECG.  Date: 02/12/2020 EKG Time: 0905 Rate: 87 Rhythm: normal sinus rhythm QRS Axis: normal Intervals: normal ST/T Wave abnormalities: normal Narrative Interpretation: no evidence of acute ischemia  ____________________________________________  RADIOLOGY  ED MD interpretation: Two view x-ray of the chest shows no evidence of acute abnormalities including no pneumonia, pneumothorax, or widened mediastinum  Official radiology report(s): DG Chest 2 View  Result Date: 02/12/2020 CLINICAL DATA:  Cough and chest pressure EXAM: CHEST - 2 VIEW COMPARISON:  None. FINDINGS: Lungs are clear. Heart size and pulmonary vascularity are normal. No adenopathy. No pneumothorax. No bone lesions. IMPRESSION: Lungs clear.  Cardiac silhouette normal. Electronically Signed   By: Lowella Grip III M.D.   On: 02/12/2020 09:41    ____________________________________________   PROCEDURES  Procedure(s) performed (including Critical Care):  .1-3 Lead EKG Interpretation  Performed by: Naaman Plummer, MD Authorized by: Naaman Plummer, MD     Interpretation: normal     ECG rate:  88   ECG rate assessment: normal     Rhythm: sinus rhythm     Ectopy: none     Conduction: normal       ____________________________________________   INITIAL IMPRESSION / ASSESSMENT AND PLAN / ED COURSE  As part of my medical decision making, I reviewed the following data within the [place] notes reviewed and incorporated, Labs reviewed, EKG interpreted, Old chart reviewed, Radiograph reviewed and Notes from prior ED visits reviewed and incorporated        Workup: ECG, CXR, CBC, BMP, Troponin Findings: ECG: No overt evidence of STEMI. No evidence of  Brugadas sign, delta wave, epsilon wave, significantly prolonged QTc, or malignant arrhythmia HS Troponin: Negative x1 Other Labs unremarkable for emergent problems. CXR: Without PTX, PNA, or widened mediastinum Last Stress Test: Never Last Heart Catheterization: Never HEART Score: 1  Given History, Exam, and Workup I have low suspicion for ACS, Pneumothorax, Pneumonia, Pulmonary Embolus, Tamponade, Aortic Dissection or other emergent problem as a cause for this presentation.   Reassesment: Prior to discharge patients pain was controlled and they were well appearing.  Disposition:  Discharge. Strict return precautions discussed with patient with full understanding. Advised patient to follow up promptly with primary care provider   Clinical Course as of 02/12/20 1239  Fri Feb 12, 2020  1238 EKG 12-Lead [EB]    Clinical Course User Index [EB] Naaman Plummer, MD     ____________________________________________   FINAL CLINICAL IMPRESSION(S) / ED DIAGNOSES  Final diagnoses:  Pleuritic chest pain  History of gastroesophageal reflux (GERD)     ED Discharge Orders         Ordered    famotidine (PEPCID) 40 MG tablet  Every evening,   Status:  Discontinued        02/12/20 1212    famotidine (PEPCID) 40 MG tablet  Every evening        02/12/20 1228           Note:  This document was prepared using Dragon voice recognition software and may include unintentional dictation errors.   Naaman Plummer, MD 02/12/20 1239

## 2020-02-12 NOTE — ED Triage Notes (Signed)
Pt to ED from [facility] with c/o generalized burning CP at this time. Pt states cough at this time. Pt states pain worse with deep inspiration. Pt A&O x4, NAD noted at this time.

## 2020-06-27 ENCOUNTER — Other Ambulatory Visit: Payer: Self-pay

## 2020-06-27 DIAGNOSIS — E119 Type 2 diabetes mellitus without complications: Secondary | ICD-10-CM | POA: Diagnosis not present

## 2020-06-27 MED ORDER — METFORMIN HCL 500 MG PO TABS
ORAL_TABLET | ORAL | 3 refills | Status: DC
  Filled 2020-06-27: qty 60, 30d supply, fill #0

## 2020-10-12 ENCOUNTER — Other Ambulatory Visit: Payer: Self-pay

## 2020-10-12 DIAGNOSIS — Z7984 Long term (current) use of oral hypoglycemic drugs: Secondary | ICD-10-CM | POA: Diagnosis not present

## 2020-10-12 DIAGNOSIS — Z1331 Encounter for screening for depression: Secondary | ICD-10-CM | POA: Diagnosis not present

## 2020-10-12 DIAGNOSIS — E1165 Type 2 diabetes mellitus with hyperglycemia: Secondary | ICD-10-CM | POA: Diagnosis not present

## 2020-10-12 DIAGNOSIS — E291 Testicular hypofunction: Secondary | ICD-10-CM | POA: Diagnosis not present

## 2020-10-12 DIAGNOSIS — Z1159 Encounter for screening for other viral diseases: Secondary | ICD-10-CM | POA: Diagnosis not present

## 2020-10-12 MED ORDER — METFORMIN HCL 1000 MG PO TABS
ORAL_TABLET | ORAL | 3 refills | Status: DC
  Filled 2020-10-12: qty 180, 90d supply, fill #0
  Filled 2021-01-16: qty 180, 90d supply, fill #1
  Filled 2021-04-26: qty 180, 90d supply, fill #2
  Filled 2021-07-27: qty 180, 90d supply, fill #3

## 2020-10-12 MED ORDER — LISINOPRIL 5 MG PO TABS
ORAL_TABLET | ORAL | 3 refills | Status: DC
  Filled 2020-10-12: qty 90, 90d supply, fill #0
  Filled 2021-01-16: qty 90, 90d supply, fill #1
  Filled 2021-06-12: qty 90, 90d supply, fill #2

## 2020-10-12 MED ORDER — OZEMPIC (0.25 OR 0.5 MG/DOSE) 2 MG/1.5ML ~~LOC~~ SOPN
PEN_INJECTOR | SUBCUTANEOUS | 3 refills | Status: DC
  Filled 2020-10-12: qty 1.5, 28d supply, fill #0
  Filled 2020-11-03: qty 4.5, 84d supply, fill #1
  Filled 2021-01-16: qty 4.5, 84d supply, fill #2
  Filled 2021-04-26: qty 1.5, 28d supply, fill #3

## 2020-10-12 MED ORDER — ATORVASTATIN CALCIUM 10 MG PO TABS
10.0000 mg | ORAL_TABLET | Freq: Every day | ORAL | 3 refills | Status: DC
  Filled 2020-10-12: qty 90, 90d supply, fill #0
  Filled 2021-01-16: qty 90, 90d supply, fill #1
  Filled 2021-06-12: qty 90, 90d supply, fill #2

## 2020-10-12 MED ORDER — GLIPIZIDE 10 MG PO TABS
ORAL_TABLET | ORAL | 3 refills | Status: DC
  Filled 2020-10-12: qty 90, 90d supply, fill #0
  Filled 2021-01-16: qty 90, 90d supply, fill #1
  Filled 2021-04-26: qty 90, 90d supply, fill #2
  Filled 2021-08-07: qty 90, 90d supply, fill #3

## 2020-10-14 ENCOUNTER — Other Ambulatory Visit: Payer: Self-pay

## 2020-10-14 DIAGNOSIS — Z1159 Encounter for screening for other viral diseases: Secondary | ICD-10-CM | POA: Diagnosis not present

## 2020-10-14 DIAGNOSIS — E7849 Other hyperlipidemia: Secondary | ICD-10-CM | POA: Diagnosis not present

## 2020-10-14 MED ORDER — ACCU-CHEK GUIDE VI STRP
ORAL_STRIP | 0 refills | Status: DC
  Filled 2020-10-14: qty 51, 30d supply, fill #0

## 2020-10-17 ENCOUNTER — Other Ambulatory Visit: Payer: Self-pay

## 2020-10-17 MED ORDER — GLUCOSE BLOOD VI STRP
ORAL_STRIP | 11 refills | Status: DC
  Filled 2020-10-17: qty 100, 90d supply, fill #0
  Filled 2021-03-30: qty 100, 90d supply, fill #1

## 2020-10-17 MED ORDER — FREESTYLE FREEDOM LITE W/DEVICE KIT
PACK | 0 refills | Status: DC
  Filled 2020-10-17: qty 1, 1d supply, fill #0

## 2020-10-18 ENCOUNTER — Other Ambulatory Visit: Payer: Self-pay

## 2020-11-03 ENCOUNTER — Other Ambulatory Visit: Payer: Self-pay

## 2020-11-07 ENCOUNTER — Other Ambulatory Visit: Payer: Self-pay

## 2021-01-12 DIAGNOSIS — I10 Essential (primary) hypertension: Secondary | ICD-10-CM | POA: Insufficient documentation

## 2021-01-12 DIAGNOSIS — E119 Type 2 diabetes mellitus without complications: Secondary | ICD-10-CM | POA: Diagnosis not present

## 2021-01-12 DIAGNOSIS — E7849 Other hyperlipidemia: Secondary | ICD-10-CM | POA: Diagnosis not present

## 2021-01-12 DIAGNOSIS — Z7185 Encounter for immunization safety counseling: Secondary | ICD-10-CM | POA: Diagnosis not present

## 2021-01-16 ENCOUNTER — Other Ambulatory Visit: Payer: Self-pay

## 2021-01-17 ENCOUNTER — Other Ambulatory Visit: Payer: Self-pay

## 2021-02-01 ENCOUNTER — Other Ambulatory Visit: Payer: Self-pay

## 2021-02-01 ENCOUNTER — Ambulatory Visit
Admission: EM | Admit: 2021-02-01 | Discharge: 2021-02-01 | Disposition: A | Discharge status: Home / Self Care | Payer: 59 | Attending: Emergency Medicine | Admitting: Emergency Medicine

## 2021-02-01 DIAGNOSIS — J111 Influenza due to unidentified influenza virus with other respiratory manifestations: Secondary | ICD-10-CM

## 2021-02-01 MED ORDER — IPRATROPIUM BROMIDE 0.06 % NA SOLN
2.0000 | Freq: Four times a day (QID) | NASAL | 12 refills | Status: DC
  Filled 2021-02-01: qty 15, 19d supply, fill #0

## 2021-02-01 MED ORDER — BENZONATATE 100 MG PO CAPS
200.0000 mg | ORAL_CAPSULE | Freq: Three times a day (TID) | ORAL | 0 refills | Status: DC
  Filled 2021-02-01: qty 21, 4d supply, fill #0

## 2021-02-01 MED ORDER — OSELTAMIVIR PHOSPHATE 75 MG PO CAPS
75.0000 mg | ORAL_CAPSULE | Freq: Two times a day (BID) | ORAL | 0 refills | Status: DC
  Filled 2021-02-01: qty 10, 5d supply, fill #0

## 2021-02-01 MED ORDER — PROMETHAZINE-DM 6.25-15 MG/5ML PO SYRP
5.0000 mL | ORAL_SOLUTION | Freq: Four times a day (QID) | ORAL | 0 refills | Status: DC | PRN
  Filled 2021-02-01: qty 118, 6d supply, fill #0

## 2021-02-01 NOTE — Discharge Instructions (Signed)
Take the Tamiflu twice daily for 5 days for treatment of influenza.  Use the Atrovent nasal spray, 2 squirts up each nostril every 6 hours, as needed for nasal congestion and runny nose.  Use over-the-counter Delsym, Zarbee's, or Robitussin during the day as needed for cough.  Use the Tessalon Perles every 8 hours as needed for cough.  Taken with a small sip of water.  You may experience some numbness to your tongue or metallic taste in her mouth, this is normal.  Use the Promethazine DM cough syrup at bedtime as will make you drowsy but it should help dry up your postnasal drip and aid you in sleep and cough relief.  Use OTC Tylenol and Ibuprofen as needed for fever or bodyaches.  Return for reevaluation, or see your primary care provider, for new or worsening symptoms.

## 2021-02-01 NOTE — ED Provider Notes (Signed)
[facility]    CSN: [id number] Arrival date & time: 02/01/21  0109      History   Chief Complaint Chief Complaint  Patient presents with   Sore Throat   Generalized Body Aches    HPI Eric Carr is a 45 y.o. male.   HPI  45 year old male here for evaluation of flulike symptoms.  Patient reports that he has been experiencing body aches, sore throat, diarrhea, and nausea that started last night.  He has not had a fever, runny nose nasal congestion, ear pain or pressure, cough, or vomiting.  His daughter has flulike symptoms as well and hers predates his presentation.  He is unaware of any other sick contacts.  Past Medical History:  Diagnosis Date   Diabetes [facility])    Hypogonadism male    Overweight     Patient Active Problem List   Diagnosis Date Noted   Diabetes ([place]) 11/26/2018   Overweight (BMI 25.0-29.9) 11/26/2018   Hypogonadism in male 11/26/2018   Elevated BP without diagnosis of hypertension 11/26/2018   Other hyperlipidemia 11/26/2018    Past Surgical History:  Procedure Laterality Date   APPENDECTOMY     CHOLECYSTECTOMY     KNEE SURGERY Left    TONSILECTOMY, ADENOIDECTOMY, BILATERAL MYRINGOTOMY AND TUBES Bilateral        Home Medications    Prior to Admission medications   Medication Sig Start Date End Date Taking? Authorizing Provider  benzonatate (TESSALON) 100 MG capsule Take 2 capsules (200 mg total) by mouth every 8 (eight) hours. 02/01/21  Yes Margarette Canada, NP  ipratropium (ATROVENT) 0.06 % nasal spray Place 2 sprays into both nostrils 4 (four) times daily. 02/01/21  Yes Margarette Canada, NP  oseltamivir (TAMIFLU) 75 MG capsule Take 1 capsule (75 mg total) by mouth every 12 (twelve) hours. 02/01/21  Yes Margarette Canada, NP  promethazine-dextromethorphan (PROMETHAZINE-DM) 6.25-15 MG/5ML syrup Take 5 mLs by mouth 4 (four) times daily as needed. 02/01/21  Yes Margarette Canada, NP  Accu-Chek FastClix Lancets MISC USE 1 TO [place]  DAILY 12/29/18   [provider]  atorvastatin (LIPITOR) 10 MG tablet Take 1 tablet (10 mg total) by mouth daily. 08/13/19   Sable Feil, PA-C  atorvastatin (LIPITOR) 10 MG tablet Take 1 tablet (10 mg total) by mouth daily. 10/12/20     Blood Glucose Monitoring Suppl (ACCU-CHEK COMPACT CARE KIT) KIT Finger stick blood sugar check bid 11/27/18   Towanda Malkin, MD  Blood Glucose Monitoring Suppl (FREESTYLE FREEDOM LITE) w/Device KIT use as directed 10/17/20     cyclobenzaprine (FLEXERIL) 10 MG tablet Take 10 mg by mouth at bedtime. 07/31/19   [provider]  famotidine (PEPCID) 40 MG tablet TAKE 1 TABLET (40 MG TOTAL) BY MOUTH EVERY EVENING. 02/12/20 02/11/21  Lavonia Drafts, MD  glipiZIDE (GLUCOTROL) 10 MG tablet Take 1 tablet (10 mg total) by mouth 2 (two) times daily before a meal. 08/13/19   Sable Feil, PA-C  glipiZIDE (GLUCOTROL) 10 MG tablet Take 1 tablet (10 mg total) by mouth 2 (two) times daily. 08/13/19   Sable Feil, PA-C  glipiZIDE (GLUCOTROL) 10 MG tablet Take 1 tablet (10 mg total) by mouth once a day 10/12/20     glucose blood (ACCU-CHEK COMPACT PLUS) test strip Use as instructed 11/27/18   Lebron Conners D, MD  glucose blood (ACCU-CHEK GUIDE) test strip Use as instructed to check blood sugar daily 10/14/20     glucose blood test strip See  admin instructions. 10/16/20     lisinopril (ZESTRIL) 10 MG tablet Take 1 tablet (10 mg total) by mouth daily. 08/13/19   Sable Feil, PA-C  lisinopril (ZESTRIL) 5 MG tablet Take 1 tablet (5 mg total) by mouth nightly. 10/12/20     Melatonin 10 MG TABS Take by mouth.    [provider]  meloxicam (MOBIC) 15 MG tablet Take 15 mg by mouth daily. 07/31/19   [provider]  metFORMIN (GLUCOPHAGE) 1000 MG tablet Take 1 tablet (1,000 mg total) by mouth 2 (two) times daily. 08/13/19   Sable Feil, PA-C  metFORMIN (GLUCOPHAGE) 1000 MG tablet Take 1 tablet (1,000 mg total) by mouth in the morning and 1  tablet (1,000 mg total) in the evening. Take with meals. 10/12/20     metFORMIN (GLUCOPHAGE) 500 MG tablet Take 1 tablet (500 mg total) by mouth 2 (two) times daily with meals 06/27/20     Semaglutide,0.25 or 0.5MG/DOS, (OZEMPIC, 0.25 OR 0.5 MG/DOSE,) 2 MG/1.5ML SOPN Take 0.44m once per week 10/12/20     sitaGLIPtin (JANUVIA) 50 MG tablet Take 1 tablet (50 mg total) by mouth daily. Patient taking differently: Take 50 mg by mouth daily. Twice a day 08/13/19   SSable Feil PA-C  sitaGLIPtin (JANUVIA) 50 MG tablet Take 1 tablet (50 mg total) by mouth daily. 08/13/19   SSable Feil PA-C    Family History History reviewed. No pertinent family history.  Social History Social History   Tobacco Use   Smoking status: Never   Smokeless tobacco: Never  Substance Use Topics   Alcohol use: Not Currently    Comment: 1-2 drinks per year on average     Allergies   Zithromax [azithromycin]   Review of Systems Review of Systems  Constitutional:  Negative for activity change, appetite change and fever.  HENT:  Positive for sore throat. Negative for congestion, ear pain and rhinorrhea.   Respiratory:  Negative for cough, shortness of breath and wheezing.   Gastrointestinal:  Positive for diarrhea and nausea. Negative for vomiting.  Musculoskeletal:  Positive for arthralgias and myalgias.  Skin:  Negative for rash.  Hematological: Negative.   Psychiatric/Behavioral: Negative.      Physical Exam Triage Vital Signs ED Triage Vitals  Enc Vitals Group     BP 02/01/21 0935 115/81     Pulse Rate 02/01/21 0935 90     Resp 02/01/21 0935 16     Temp 02/01/21 0935 97.8 F (36.6 C)     Temp Source 02/01/21 0935 Temporal     SpO2 02/01/21 0935 99 %     Weight --      Height --      Head Circumference --      Peak Flow --      Pain Score 02/01/21 0928 0     Pain Loc --      Pain Edu? --      [place]cl. in [place] --    No data found.  Updated Vital Signs BP 115/81 (BP Location: Left Arm)    Pulse 90   Temp 97.8 F (36.6 C) (Temporal)   Resp 16   SpO2 99%   Visual Acuity Right Eye Distance:   Left Eye Distance:   Bilateral Distance:    Right Eye Near:   Left Eye Near:    Bilateral Near:     Physical Exam Vitals and nursing note reviewed.  Constitutional:      General: He is  not in acute distress.    Appearance: Normal appearance. He is not ill-appearing.  HENT:     Head: Normocephalic and atraumatic.     Right Ear: Tympanic membrane, ear canal and external ear normal. There is no impacted cerumen.     Left Ear: Tympanic membrane, ear canal and external ear normal. There is no impacted cerumen.     Nose: Congestion and rhinorrhea present.     Mouth/Throat:     Mouth: Mucous membranes are moist.     Pharynx: Oropharynx is clear. Posterior oropharyngeal erythema present.  Cardiovascular:     Rate and Rhythm: Normal rate and regular rhythm.     Pulses: Normal pulses.     Heart sounds: Normal heart sounds. No murmur heard.   No gallop.  Pulmonary:     Effort: Pulmonary effort is normal.     Breath sounds: Normal breath sounds. No wheezing, rhonchi or rales.  Musculoskeletal:     Cervical back: Normal range of motion and neck supple.  Lymphadenopathy:     Cervical: No cervical adenopathy.  Skin:    General: Skin is warm and dry.     Capillary Refill: Capillary refill takes less than 2 seconds.     Findings: No erythema or rash.  Neurological:     General: No focal deficit present.     Mental Status: He is alert and oriented to person, place, and time.  Psychiatric:        Mood and Affect: Mood normal.        Behavior: Behavior normal.        Thought Content: Thought content normal.        Judgment: Judgment normal.     UC Treatments / Results  Labs (all labs ordered are listed, but only abnormal results are displayed) Labs Reviewed - No data to display  EKG   Radiology No results found.  Procedures Procedures (including critical care  time)  Medications Ordered in UC Medications - No data to display  Initial Impression / Assessment and Plan / UC Course  I have reviewed the triage vital signs and the nursing notes.  Pertinent labs & imaging results that were available during my care of the patient were reviewed by me and considered in my medical decision making (see chart for details).  Patient is a very pleasant, nontoxic-appearing 45-year-old male here for evaluation of flulike symptoms as outlined HPI above.  Patient is early in his presentation and is only complaining of a sore throat, diarrhea, nausea, and body aches at this time.  On physical exam he has pearly-gray tympanic membranes bilaterally with normal light reflex and clear external auditory canals.  Nasal mucosa is erythematous and edematous with mild clear nasal discharge in both nares.  Posterior oropharynx is mildly erythematous with clear postnasal drip.  No cervical lymphadenopathy appreciated exam.  Cardiopulmonary exam reveals clear lung sounds in all fields.  His daughter is also experiencing similar symptoms that been going on longer and are more well-defined.  Her symptoms are consistent with flu and I will treat him for presumptive influenza with Tamiflu twice daily for 5 days, Atrovent nasal spray to help with nasal congestion, Tessalon Perles and Promethazine DM consult of cough, congestion, and nausea as needed.  Work note provided.   Final Clinical Impressions(s) / UC Diagnoses   Final diagnoses:  Influenza-like illness     Discharge Instructions      Take the Tamiflu twice daily for 5 days for treatment of   influenza.  Use the Atrovent nasal spray, 2 squirts up each nostril every 6 hours, as needed for nasal congestion and runny nose.  Use over-the-counter Delsym, Zarbee's, or Robitussin during the day as needed for cough.  Use the Tessalon Perles every 8 hours as needed for cough.  Taken with a small sip of water.  You may experience some  numbness to your tongue or metallic taste in her mouth, this is normal.  Use the Promethazine DM cough syrup at bedtime as will make you drowsy but it should help dry up your postnasal drip and aid you in sleep and cough relief.  Use OTC Tylenol and Ibuprofen as needed for fever or bodyaches.  Return for reevaluation, or see your primary care provider, for new or worsening symptoms.      ED Prescriptions     Medication Sig Dispense Auth. Provider   oseltamivir (TAMIFLU) 75 MG capsule Take 1 capsule (75 mg total) by mouth every 12 (twelve) hours. 10 capsule Margarette Canada, NP   ipratropium (ATROVENT) 0.06 % nasal spray Place 2 sprays into both nostrils 4 (four) times daily. 15 mL Margarette Canada, NP   benzonatate (TESSALON) 100 MG capsule Take 2 capsules (200 mg total) by mouth every 8 (eight) hours. 21 capsule Margarette Canada, NP   promethazine-dextromethorphan (PROMETHAZINE-DM) 6.25-15 MG/5ML syrup Take 5 mLs by mouth 4 (four) times daily as needed. 118 mL Margarette Canada, NP      PDMP not reviewed this encounter.   Margarette Canada, NP 02/01/21 1032

## 2021-02-01 NOTE — ED Triage Notes (Signed)
Patient presents to Urgent Care with complaints of sore throat and body aches since yesterday. Not treating symptoms.   Denies fever.

## 2021-02-08 ENCOUNTER — Ambulatory Visit (INDEPENDENT_AMBULATORY_CARE_PROVIDER_SITE_OTHER): Payer: 59

## 2021-02-08 ENCOUNTER — Other Ambulatory Visit: Payer: Self-pay

## 2021-02-08 ENCOUNTER — Ambulatory Visit
Admission: EM | Admit: 2021-02-08 | Discharge: 2021-02-08 | Disposition: A | Discharge status: Home / Self Care | Payer: 59 | Attending: Emergency Medicine | Admitting: Emergency Medicine

## 2021-02-08 DIAGNOSIS — Z20822 Contact with and (suspected) exposure to covid-19: Secondary | ICD-10-CM | POA: Diagnosis not present

## 2021-02-08 DIAGNOSIS — R059 Cough, unspecified: Secondary | ICD-10-CM

## 2021-02-08 DIAGNOSIS — B974 Respiratory syncytial virus as the cause of diseases classified elsewhere: Secondary | ICD-10-CM | POA: Insufficient documentation

## 2021-02-08 DIAGNOSIS — Z8616 Personal history of COVID-19: Secondary | ICD-10-CM | POA: Diagnosis not present

## 2021-02-08 DIAGNOSIS — J0140 Acute pansinusitis, unspecified: Secondary | ICD-10-CM | POA: Diagnosis not present

## 2021-02-08 DIAGNOSIS — R509 Fever, unspecified: Secondary | ICD-10-CM | POA: Diagnosis not present

## 2021-02-08 DIAGNOSIS — E119 Type 2 diabetes mellitus without complications: Secondary | ICD-10-CM | POA: Diagnosis not present

## 2021-02-08 DIAGNOSIS — I10 Essential (primary) hypertension: Secondary | ICD-10-CM | POA: Insufficient documentation

## 2021-02-08 DIAGNOSIS — B338 Other specified viral diseases: Secondary | ICD-10-CM | POA: Insufficient documentation

## 2021-02-08 LAB — RESP PANEL BY RT-PCR (FLU A&B, COVID) ARPGX2
Influenza A by PCR: NEGATIVE
Influenza B by PCR: NEGATIVE
SARS Coronavirus 2 by RT PCR: NEGATIVE

## 2021-02-08 MED ORDER — IBUPROFEN 600 MG PO TABS
600.0000 mg | ORAL_TABLET | Freq: Four times a day (QID) | ORAL | 0 refills | Status: DC | PRN
  Filled 2021-02-08: qty 30, 8d supply, fill #0

## 2021-02-08 MED ORDER — AMOXICILLIN-POT CLAVULANATE 875-125 MG PO TABS
1.0000 | ORAL_TABLET | Freq: Two times a day (BID) | ORAL | 0 refills | Status: DC
  Filled 2021-02-08: qty 14, 7d supply, fill #0

## 2021-02-08 MED ORDER — FLUTICASONE PROPIONATE 50 MCG/ACT NA SUSP
2.0000 | Freq: Every day | NASAL | 0 refills | Status: DC
  Filled 2021-02-08: qty 16, 30d supply, fill #0

## 2021-02-08 MED ORDER — HYDROCOD POLST-CPM POLST ER 10-8 MG/5ML PO SUER
5.0000 mL | Freq: Two times a day (BID) | ORAL | 0 refills | Status: DC | PRN
  Filled 2021-02-08: qty 60, 6d supply, fill #0

## 2021-02-08 NOTE — ED Provider Notes (Signed)
HPI  SUBJECTIVE:  Eric Carr is a 45 y.o. male who presents with fevers T-max 100.3 today.  He reports persistent body aches "all week long ", headache, nasal congestion, clear rhinorrhea, 4 days of sinus pain and pressure, cough, shortness of breath at rest, diarrhea.  He reports resolving sore throat, postnasal drip.  No facial swelling, upper dental pain, wheezing, dyspnea on exertion.  Spouse is states that the patient seems to wear out very easily.  No loss of sense of smell or taste, nausea, vomiting, fevers, abdominal pain.  He works at [employer] in [employer].  He has had multiple COVID and flu exposures.  He got the COVID and flu vaccines.  No antibiotics in the past 3 months.  No antipyretic in the past 6 hours.  He states that he is unable to sleep at night secondary to the cough.  He has tried NyQuil without improvement in symptoms.  Symptoms are worse with activity.  Patient was seen here on 12/7 for a flulike illness, was treated empirically for the flu with Tamiflu.  He has a past medical history of diabetes, hypertension, COVID in 2021.  PMD: [facility].  Past Medical History:  Diagnosis Date   Diabetes ([place])    Hypogonadism male    Overweight     Past Surgical History:  Procedure Laterality Date   APPENDECTOMY     CHOLECYSTECTOMY     KNEE SURGERY Left    TONSILECTOMY, ADENOIDECTOMY, BILATERAL MYRINGOTOMY AND TUBES Bilateral     No family history on file.  Social History   Tobacco Use   Smoking status: Never   Smokeless tobacco: Never  Vaping Use   Vaping Use: Never used  Substance Use Topics   Alcohol use: Not Currently    Comment: 1-2 drinks per year on average   Drug use: Not Currently    No current facility-administered medications for this encounter.  Current Outpatient Medications:    Accu-Chek FastClix Lancets MISC, USE 1 TO CHECK GLUCOSE ONCE DAILY, Disp: , Rfl:    amoxicillin-clavulanate (AUGMENTIN) 875-125 MG tablet, Take 1 tablet by mouth 2  (two) times daily. X 7 days, Disp: 14 tablet, Rfl: 0   atorvastatin (LIPITOR) 10 MG tablet, Take 1 tablet (10 mg total) by mouth daily., Disp: 90 tablet, Rfl: 3   benzonatate (TESSALON) 100 MG capsule, Take 2 capsules (200 mg total) by mouth every 8 (eight) hours., Disp: 21 capsule, Rfl: 0   Blood Glucose Monitoring Suppl (ACCU-CHEK COMPACT CARE KIT) KIT, Finger stick blood sugar check bid, Disp: 1 kit, Rfl: 0   Blood Glucose Monitoring Suppl (FREESTYLE FREEDOM LITE) w/Device KIT, use as directed, Disp: 1 kit, Rfl: 0   chlorpheniramine-HYDROcodone (TUSSIONEX PENNKINETIC ER) 10-8 MG/5ML SUER, Take 5 mLs by mouth every 12 (twelve) hours as needed for cough., Disp: 60 mL, Rfl: 0   fluticasone (FLONASE) 50 MCG/ACT nasal spray, Place 2 sprays into both nostrils daily., Disp: 16 g, Rfl: 0   glipiZIDE (GLUCOTROL) 10 MG tablet, Take 1 tablet (10 mg total) by mouth 2 (two) times daily before a meal., Disp: 60 tablet, Rfl: 3   glucose blood (ACCU-CHEK COMPACT PLUS) test strip, Use as instructed, Disp: 100 each, Rfl: 12   glucose blood test strip, See admin instructions., Disp: 100 each, Rfl: 11   ibuprofen (ADVIL) 600 MG tablet, Take 1 tablet (600 mg total) by mouth every 6 (six) hours as needed., Disp: 30 tablet, Rfl: 0   ipratropium (ATROVENT) 0.06 % nasal spray, Place  2 sprays into both nostrils 4 (four) times daily., Disp: 15 mL, Rfl: 12   lisinopril (ZESTRIL) 10 MG tablet, Take 1 tablet (10 mg total) by mouth daily., Disp: 90 tablet, Rfl: 3   lisinopril (ZESTRIL) 5 MG tablet, Take 1 tablet (5 mg total) by mouth nightly., Disp: 90 tablet, Rfl: 3   Melatonin 10 MG TABS, Take by mouth., Disp: , Rfl:    metFORMIN (GLUCOPHAGE) 1000 MG tablet, Take 1 tablet (1,000 mg total) by mouth 2 (two) times daily., Disp: 180 tablet, Rfl: 3   oseltamivir (TAMIFLU) 75 MG capsule, Take 1 capsule (75 mg total) by mouth every 12 (twelve) hours., Disp: 10 capsule, Rfl: 0   Semaglutide,0.25 or 0.5MG/DOS, (OZEMPIC, 0.25 OR 0.5  MG/DOSE,) 2 MG/1.5ML SOPN, Take 0.60m once per week, Disp: 4.5 mL, Rfl: 3   atorvastatin (LIPITOR) 10 MG tablet, Take 1 tablet (10 mg total) by mouth daily., Disp: 90 tablet, Rfl: 3   cyclobenzaprine (FLEXERIL) 10 MG tablet, Take 10 mg by mouth at bedtime., Disp: , Rfl:    famotidine (PEPCID) 40 MG tablet, TAKE 1 TABLET (40 MG TOTAL) BY MOUTH EVERY EVENING., Disp: 30 tablet, Rfl: 1   glipiZIDE (GLUCOTROL) 10 MG tablet, Take 1 tablet (10 mg total) by mouth 2 (two) times daily., Disp: 180 tablet, Rfl: 3   glipiZIDE (GLUCOTROL) 10 MG tablet, Take 1 tablet (10 mg total) by mouth once a day, Disp: 90 tablet, Rfl: 3   glucose blood (ACCU-CHEK GUIDE) test strip, Use as instructed to check blood sugar daily, Disp: 50 strip, Rfl: 0   meloxicam (MOBIC) 15 MG tablet, Take 15 mg by mouth daily., Disp: , Rfl:    metFORMIN (GLUCOPHAGE) 1000 MG tablet, Take 1 tablet (1,000 mg total) by mouth in the morning and 1 tablet (1,000 mg total) in the evening. Take with meals., Disp: 180 tablet, Rfl: 3   metFORMIN (GLUCOPHAGE) 500 MG tablet, Take 1 tablet (500 mg total) by mouth 2 (two) times daily with meals, Disp: 60 tablet, Rfl: 3   sitaGLIPtin (JANUVIA) 50 MG tablet, Take 1 tablet (50 mg total) by mouth daily. (Patient taking differently: Take 50 mg by mouth daily. Twice a day), Disp: 90 tablet, Rfl: 3   sitaGLIPtin (JANUVIA) 50 MG tablet, Take 1 tablet (50 mg total) by mouth daily., Disp: 90 tablet, Rfl: 1  Allergies  Allergen Reactions   Zithromax [Azithromycin] Dermatitis     ROS  As noted in HPI.   Physical Exam  BP 122/72 (BP Location: Right Arm)    Pulse (!) 124    Temp 99.8 F (37.7 C) (Oral)    Wt 85.3 kg    SpO2 97%    BMI 27.76 kg/m    Constitutional: Well developed, well nourished, no acute distress Eyes:  EOMI, conjunctiva normal bilaterally HENT: Normocephalic, atraumatic,mucus membranes moist.  Normal turbinates.  Mucoid nasal congestion.  Positive maxillary sinus tenderness.  No frontal  sinus tenderness.  Tonsils surgically absent.  Slightly erythematous oropharynx.  No obvious postnasal drip. Neck: No cervical lymphadenopathy Respiratory: Normal inspiratory effort, lungs clear bilaterally, good air movement. Cardiovascular: Regular tachycardia, no murmurs, rubs, gallops.  No anterior, lateral chest wall tenderness. GI: nondistended skin: No rash, skin intact Musculoskeletal: no deformities Neurologic: Alert & oriented x 3, no focal neuro deficits Psychiatric: Speech and behavior appropriate   ED Course   Medications - No data to display  Orders Placed This Encounter  Procedures   Resp Panel by RT-PCR (Flu A&B, Covid) Nasopharyngeal  Swab    Standing Status:   Standing    Number of Occurrences:   1   DG Chest 2 View    Standing Status:   Standing    Number of Occurrences:   1    Order Specific Question:   Reason for Exam (SYMPTOM  OR DIAGNOSIS REQUIRED)    Answer:   Cough, flulike symptoms, rule out pneumonia   Airborne and Contact precautions    Standing Status:   Standing    Number of Occurrences:   1    Results for orders placed or performed during the hospital encounter of 02/08/21 (from the past 24 hour(s))  Resp Panel by RT-PCR (Flu A&B, Covid) Nasopharyngeal Swab     Status: None   Collection Time: 02/08/21  4:25 PM   Specimen: Nasopharyngeal Swab; Nasopharyngeal(NP) swabs in vial transport medium  Result Value Ref Range   SARS Coronavirus 2 by RT PCR NEGATIVE NEGATIVE   Influenza A by PCR NEGATIVE NEGATIVE   Influenza B by PCR NEGATIVE NEGATIVE   DG Chest 2 View  Result Date: 02/08/2021 CLINICAL DATA:  Cough and flu-like symptoms.  Fever. EXAM: CHEST - 2 VIEW COMPARISON:  02/12/2020 FINDINGS: The cardiac silhouette, mediastinal and hilar contours are normal. The lungs are clear. No pleural effusions. No pulmonary lesions. The bony thorax is intact. IMPRESSION: No acute cardiopulmonary findings. Electronically Signed   By: Marijo Sanes M.D.   On:  02/08/2021 17:14    ED Clinical Impression  1. RSV infection   2. Acute non-recurrent pansinusitis      ED Assessment/Plan  Previous records reviewed.  As noted in HPI.  [facility] narcotic database reviewed.  Prescribed Promethazine DM in 2021.  No opiate prescriptions in the past 2 years.  Checking COVID, flu, wonder if he has a new infection.  will also check a chest x-ray as it could be old infection with a secondary pneumonia.  If COVID, flu, chest x-ray are negative, will treat as a sinusitis with Augmentin.  Home with Flonase, saline nasal irrigation, Mucinex, Tussionex.  COVID, flu negative.  RSV positive.  [place] Narcotic database reviewed for this patient, and feel that the risk/benefit ratio today is favorable for proceeding with a prescription for controlled substance.  Reviewed imaging independently.  Normal chest x-ray.  See radiology report for full details.   X-ray negative for pneumonia. will still treat as a sinusitis given duration of sinus pain and pressure.  Plan as above.  Work note for several days.  Discussed labs, imaging, MDM, treatment plan, and plan for follow-up with patient spouse.  Discussed sn/sx that should prompt return to the ED. they agree with plan.   Meds ordered this encounter  Medications   amoxicillin-clavulanate (AUGMENTIN) 875-125 MG tablet    Sig: Take 1 tablet by mouth 2 (two) times daily. X 7 days    Dispense:  14 tablet    Refill:  0   chlorpheniramine-HYDROcodone (TUSSIONEX PENNKINETIC ER) 10-8 MG/5ML SUER    Sig: Take 5 mLs by mouth every 12 (twelve) hours as needed for cough.    Dispense:  60 mL    Refill:  0   ibuprofen (ADVIL) 600 MG tablet    Sig: Take 1 tablet (600 mg total) by mouth every 6 (six) hours as needed.    Dispense:  30 tablet    Refill:  0   fluticasone (FLONASE) 50 MCG/ACT nasal spray    Sig: Place 2 sprays into both nostrils daily.  Dispense:  16 g    Refill:  0       *This clinic note was created  using [employer]. Therefore, there may be occasional mistakes despite careful proofreading.  ?    Melynda Ripple, MD 02/08/21 2125

## 2021-02-08 NOTE — ED Triage Notes (Signed)
Patient here with "Flu like symptoms". Fever "now" starting this morning, up to 100.3. Body aches remain. Needs doctor note for job. Finished meds for the "Flu" from most recent visit. Cough. Runny nose. Flu vaccine done.

## 2021-02-08 NOTE — Discharge Instructions (Addendum)
COVID, influenza negative.  RSV is positive.  Your chest x-ray was normal.  However, I am concerned that you have a sinus infection.  Finish the Augmentin, even if you feel better.  Flonase, saline nasal irrigation with a [facility] Med rinse and distilled water as often as you want, Mucinex, Tussionex for the cough.

## 2021-02-09 ENCOUNTER — Other Ambulatory Visit: Payer: Self-pay

## 2021-03-30 ENCOUNTER — Other Ambulatory Visit: Payer: Self-pay

## 2021-04-07 ENCOUNTER — Other Ambulatory Visit: Payer: Self-pay

## 2021-04-26 ENCOUNTER — Other Ambulatory Visit: Payer: Self-pay

## 2021-05-15 ENCOUNTER — Other Ambulatory Visit: Payer: Self-pay

## 2021-05-15 DIAGNOSIS — E7849 Other hyperlipidemia: Secondary | ICD-10-CM | POA: Diagnosis not present

## 2021-05-15 DIAGNOSIS — E119 Type 2 diabetes mellitus without complications: Secondary | ICD-10-CM | POA: Diagnosis not present

## 2021-05-15 DIAGNOSIS — I10 Essential (primary) hypertension: Secondary | ICD-10-CM | POA: Diagnosis not present

## 2021-05-15 DIAGNOSIS — Z7185 Encounter for immunization safety counseling: Secondary | ICD-10-CM | POA: Diagnosis not present

## 2021-05-15 MED ORDER — OZEMPIC (1 MG/DOSE) 4 MG/3ML ~~LOC~~ SOPN
PEN_INJECTOR | SUBCUTANEOUS | 1 refills | Status: DC
  Filled 2021-05-15: qty 9, 84d supply, fill #0
  Filled 2021-08-07: qty 9, 84d supply, fill #1

## 2021-06-12 ENCOUNTER — Other Ambulatory Visit: Payer: Self-pay

## 2021-07-27 ENCOUNTER — Other Ambulatory Visit: Payer: Self-pay

## 2021-08-07 ENCOUNTER — Other Ambulatory Visit: Payer: Self-pay

## 2021-09-25 ENCOUNTER — Other Ambulatory Visit: Payer: Self-pay

## 2021-09-25 DIAGNOSIS — I10 Essential (primary) hypertension: Secondary | ICD-10-CM | POA: Diagnosis not present

## 2021-09-25 DIAGNOSIS — E7849 Other hyperlipidemia: Secondary | ICD-10-CM | POA: Diagnosis not present

## 2021-09-25 DIAGNOSIS — E119 Type 2 diabetes mellitus without complications: Secondary | ICD-10-CM | POA: Diagnosis not present

## 2021-09-25 DIAGNOSIS — E663 Overweight: Secondary | ICD-10-CM | POA: Diagnosis not present

## 2021-09-25 DIAGNOSIS — B360 Pityriasis versicolor: Secondary | ICD-10-CM | POA: Diagnosis not present

## 2021-09-25 MED ORDER — LISINOPRIL 5 MG PO TABS
ORAL_TABLET | ORAL | 3 refills | Status: DC
  Filled 2021-09-25: qty 90, 90d supply, fill #0
  Filled 2022-03-13: qty 90, 90d supply, fill #1

## 2021-09-25 MED ORDER — KETOCONAZOLE 2 % EX SHAM
MEDICATED_SHAMPOO | CUTANEOUS | 2 refills | Status: DC
  Filled 2021-09-25: qty 120, 20d supply, fill #0

## 2021-09-25 MED ORDER — METFORMIN HCL 1000 MG PO TABS
ORAL_TABLET | ORAL | 3 refills | Status: DC
  Filled 2021-09-25 – 2022-01-19 (×2): qty 180, 90d supply, fill #0

## 2021-09-25 MED ORDER — ATORVASTATIN CALCIUM 10 MG PO TABS
10.0000 mg | ORAL_TABLET | Freq: Every day | ORAL | 3 refills | Status: DC
  Filled 2021-09-25: qty 90, 90d supply, fill #0
  Filled 2022-03-13: qty 90, 90d supply, fill #1

## 2021-09-25 MED ORDER — GLIPIZIDE 10 MG PO TABS
ORAL_TABLET | ORAL | 3 refills | Status: DC
  Filled 2021-09-25 – 2021-11-22 (×2): qty 90, 90d supply, fill #0

## 2021-09-25 MED ORDER — OZEMPIC (1 MG/DOSE) 4 MG/3ML ~~LOC~~ SOPN
PEN_INJECTOR | SUBCUTANEOUS | 1 refills | Status: DC
  Filled 2021-09-25: qty 9, 84d supply, fill #0

## 2021-10-03 DIAGNOSIS — E11319 Type 2 diabetes mellitus with unspecified diabetic retinopathy without macular edema: Secondary | ICD-10-CM | POA: Insufficient documentation

## 2021-10-18 ENCOUNTER — Other Ambulatory Visit: Payer: Self-pay

## 2021-10-18 MED ORDER — OZEMPIC (1 MG/DOSE) 4 MG/3ML ~~LOC~~ SOPN
PEN_INJECTOR | SUBCUTANEOUS | 1 refills | Status: DC
  Filled 2021-10-18: qty 3, 28d supply, fill #0

## 2021-11-14 ENCOUNTER — Other Ambulatory Visit: Payer: Self-pay

## 2021-11-14 MED ORDER — OZEMPIC (2 MG/DOSE) 8 MG/3ML ~~LOC~~ SOPN
PEN_INJECTOR | SUBCUTANEOUS | 1 refills | Status: DC
  Filled 2021-11-14: qty 3, 28d supply, fill #0
  Filled 2021-12-29: qty 3, 28d supply, fill #1
  Filled 2022-02-02: qty 3, 28d supply, fill #2
  Filled 2022-03-13: qty 3, 28d supply, fill #3

## 2021-11-22 ENCOUNTER — Other Ambulatory Visit: Payer: Self-pay

## 2021-11-22 MED ORDER — KROGER BLOOD GLUCOSE TEST VI STRP
ORAL_STRIP | 1 refills | Status: DC
Start: 2021-11-22 — End: 2022-06-28
  Filled 2021-11-22: qty 100, 90d supply, fill #0

## 2021-12-14 ENCOUNTER — Other Ambulatory Visit: Payer: Self-pay

## 2021-12-21 IMAGING — CR DG CHEST 2V
2 series · 2 of 2 positions shown · non-contrast
Comparison: 02/12/2020

CLINICAL DATA: Cough and flu-like symptoms.  Fever.

EXAM:
CHEST - 2 VIEW

[chest pa]
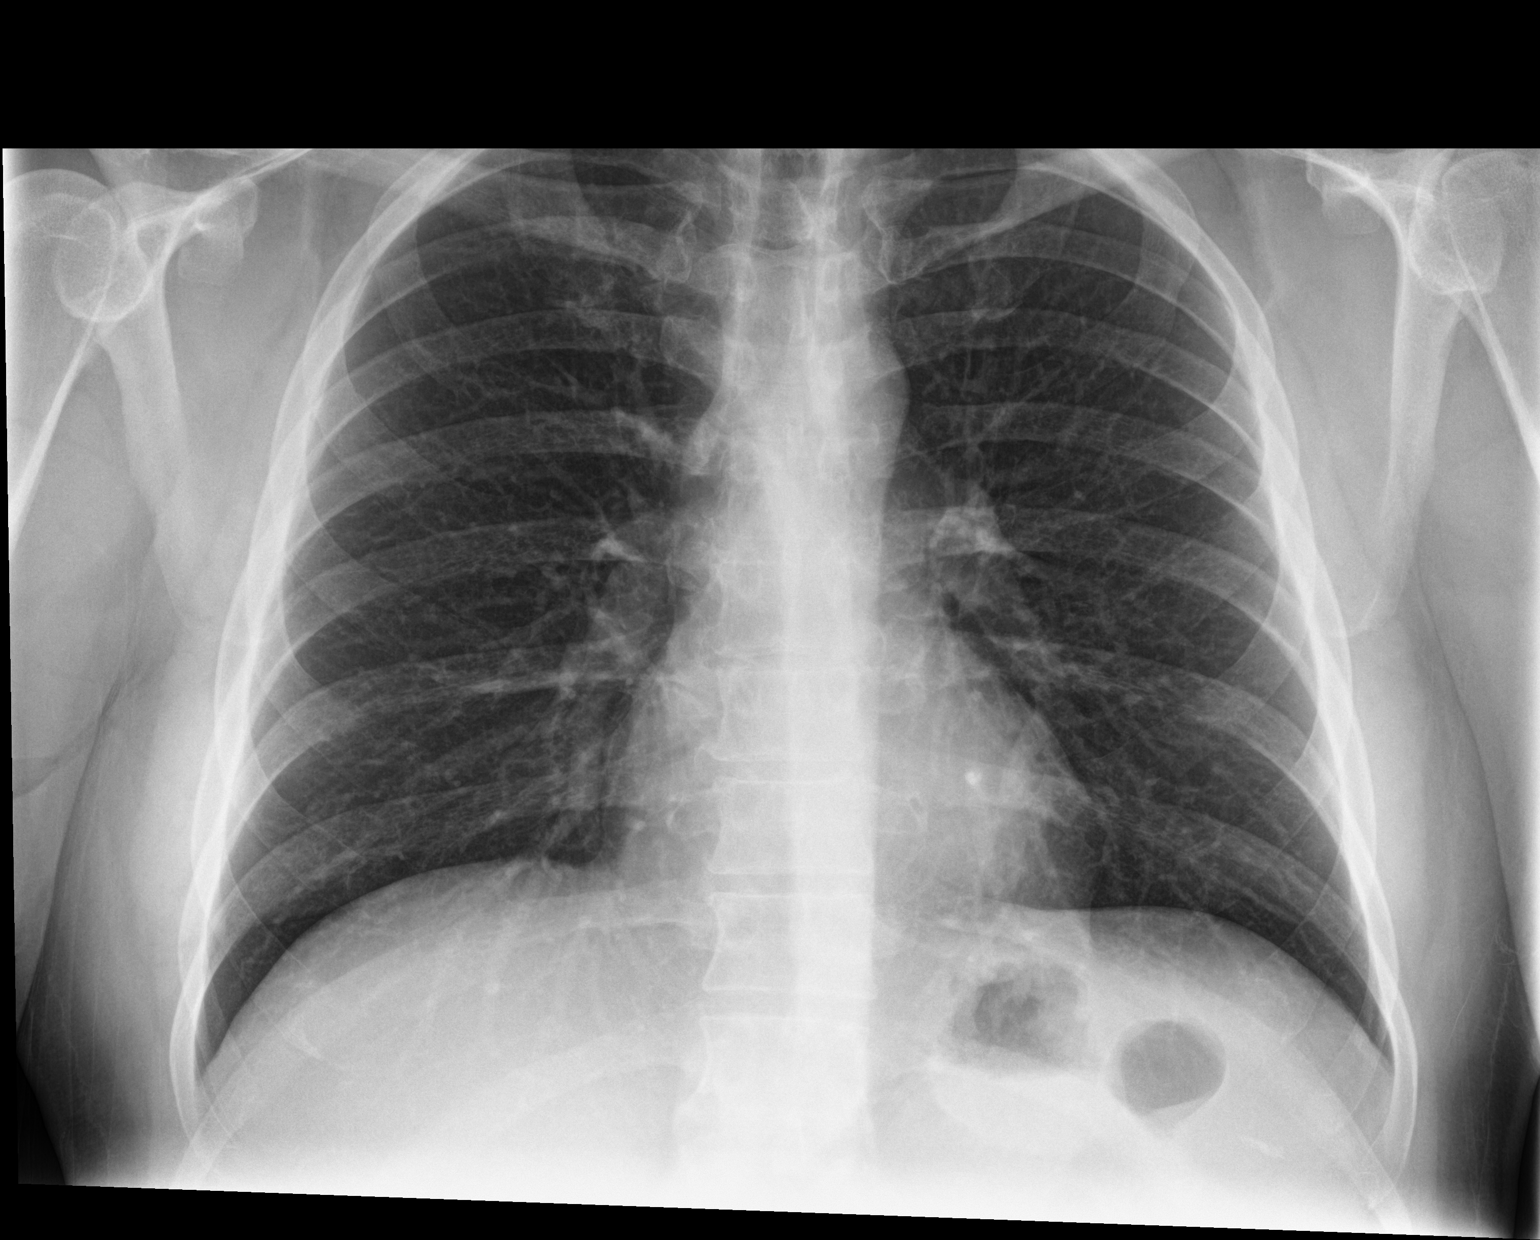

[chest lat]
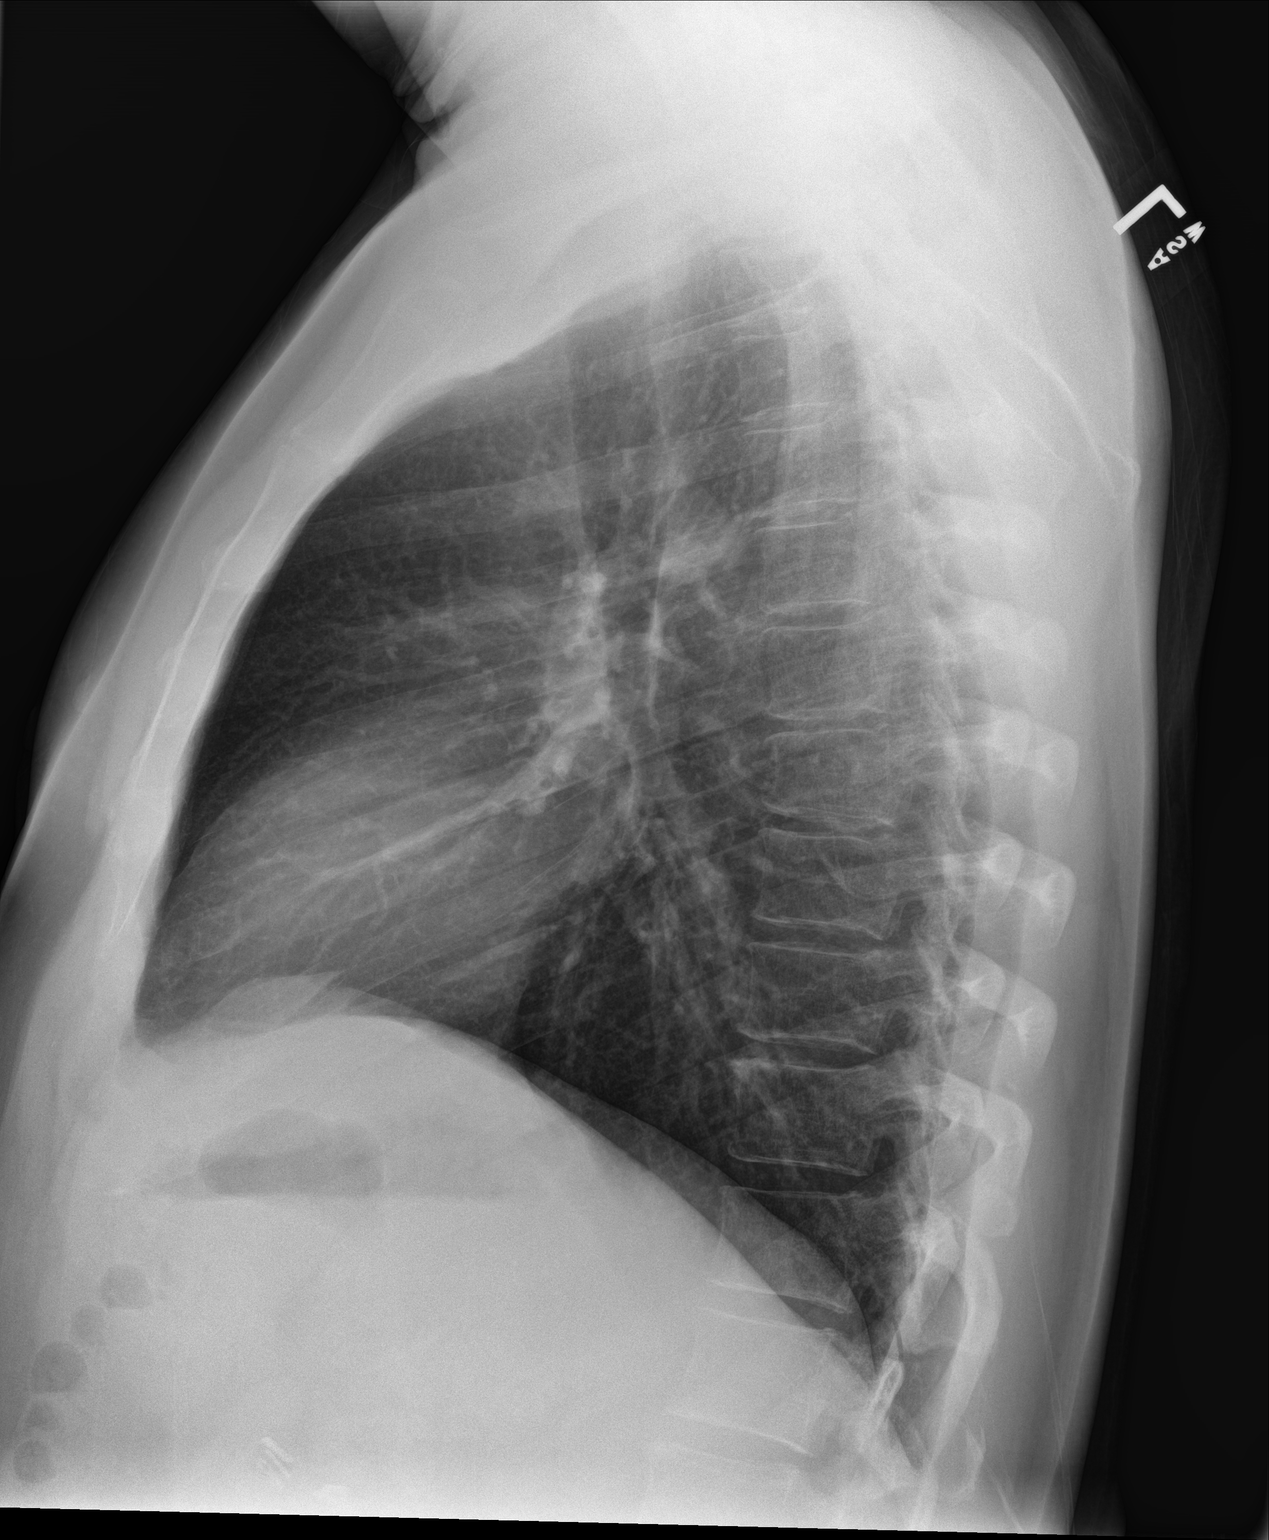

[2 of 2 positions shown; findings below may reference images not displayed]

FINDINGS: The cardiac silhouette, mediastinal and hilar contours are normal.
The lungs are clear. No pleural effusions. No pulmonary lesions. The
bony thorax is intact.
IMPRESSION: No acute cardiopulmonary findings.

## 2021-12-29 ENCOUNTER — Other Ambulatory Visit: Payer: Self-pay

## 2022-01-19 ENCOUNTER — Other Ambulatory Visit: Payer: Self-pay

## 2022-01-29 ENCOUNTER — Ambulatory Visit
Admission: EM | Admit: 2022-01-29 | Discharge: 2022-01-29 | Disposition: A | Discharge status: Home / Self Care | Payer: 59 | Attending: Internal Medicine | Admitting: Internal Medicine

## 2022-01-29 ENCOUNTER — Other Ambulatory Visit: Payer: Self-pay

## 2022-01-29 DIAGNOSIS — U071 COVID-19: Secondary | ICD-10-CM | POA: Insufficient documentation

## 2022-01-29 LAB — RESP PANEL BY RT-PCR (RSV, FLU A&B, COVID)  RVPGX2
Influenza A by PCR: NEGATIVE
Influenza B by PCR: NEGATIVE
Resp Syncytial Virus by PCR: NEGATIVE
SARS Coronavirus 2 by RT PCR: POSITIVE — AB

## 2022-01-29 MED ORDER — BENZONATATE 100 MG PO CAPS
100.0000 mg | ORAL_CAPSULE | Freq: Three times a day (TID) | ORAL | 0 refills | Status: DC
  Filled 2022-01-29: qty 21, 7d supply, fill #0

## 2022-01-29 MED ORDER — MOLNUPIRAVIR 200 MG PO CAPS
4.0000 | ORAL_CAPSULE | Freq: Two times a day (BID) | ORAL | 0 refills | Status: AC
  Filled 2022-01-29: qty 40, 5d supply, fill #0

## 2022-01-29 NOTE — ED Provider Notes (Signed)
[facility]    CSN: [id number] Arrival date & time: 01/29/22  1002      History   Chief Complaint No chief complaint on file.   HPI Eric Carr is a 46 y.o. male.   Pt 47 yom reports HA, nasal congestion, body aches, fever (103 Saturday night), chills. Taking Nyquil. Daughter and wife with similar symptoms, daughter started first. Daughter went back to school today. Pt boss also COVID positive last week as well. No flu shot this year. No recent COVID booster (got COVID 2 months after the vaccination).     Past Medical History:  Diagnosis Date   Diabetes [facility])    Hypogonadism male    Overweight     Patient Active Problem List   Diagnosis Date Noted   Diabetes ([place]) 11/26/2018   Overweight (BMI 25.0-29.9) 11/26/2018   Hypogonadism in male 11/26/2018   Elevated BP without diagnosis of hypertension 11/26/2018   Other hyperlipidemia 11/26/2018    Past Surgical History:  Procedure Laterality Date   APPENDECTOMY     CHOLECYSTECTOMY     KNEE SURGERY Left    TONSILECTOMY, ADENOIDECTOMY, BILATERAL MYRINGOTOMY AND TUBES Bilateral        Home Medications    Prior to Admission medications   Medication Sig Start Date End Date Taking? Authorizing Provider  benzonatate (TESSALON) 100 MG capsule Take 1 capsule (100 mg total) by mouth every 8 (eight) hours. 01/29/22  Yes Luvenia Redden, PA-C  molnupiravir EUA (LAGEVRIO) 200 MG CAPS capsule Take 4 capsules (800 mg total) by mouth 2 (two) times daily for 5 days. 01/29/22 02/03/22 Yes Luvenia Redden, PA-C  Accu-Chek FastClix Lancets MISC USE 1 TO CHECK GLUCOSE ONCE DAILY 12/29/18   [provider]  amoxicillin-clavulanate (AUGMENTIN) 875-125 MG tablet Take 1 tablet by mouth 2 (two) times daily. X 7 days 02/08/21   Melynda Ripple, MD  atorvastatin (LIPITOR) 10 MG tablet Take 1 tablet (10 mg total) by mouth daily. 08/13/19   Sable Feil, PA-C  atorvastatin (LIPITOR) 10 MG tablet Take 1 tablet (10 mg  total) by mouth daily. 10/12/20     atorvastatin (LIPITOR) 10 MG tablet Take 1 tablet (10 mg total) by mouth daily. 09/25/21     benzonatate (TESSALON) 100 MG capsule Take 2 capsules (200 mg total) by mouth every 8 (eight) hours. 02/01/21   Margarette Canada, NP  Blood Glucose Monitoring Suppl (ACCU-CHEK COMPACT CARE KIT) KIT Finger stick blood sugar check bid 11/27/18   Towanda Malkin, MD  Blood Glucose Monitoring Suppl (FREESTYLE FREEDOM LITE) w/Device KIT use as directed 10/17/20     chlorpheniramine-HYDROcodone (TUSSIONEX PENNKINETIC ER) 10-8 MG/5ML SUER Take 5 mLs by mouth every 12 (twelve) hours as needed for cough. 02/08/21   Melynda Ripple, MD  cyclobenzaprine (FLEXERIL) 10 MG tablet Take 10 mg by mouth at bedtime. 07/31/19   [provider]  famotidine (PEPCID) 40 MG tablet TAKE 1 TABLET (40 MG TOTAL) BY MOUTH EVERY EVENING. 02/12/20 02/11/21  Lavonia Drafts, MD  fluticasone (FLONASE) 50 MCG/ACT nasal spray Place 2 sprays into both nostrils daily. 02/08/21   Melynda Ripple, MD  glipiZIDE (GLUCOTROL) 10 MG tablet Take 1 tablet (10 mg total) by mouth 2 (two) times daily before a meal. 08/13/19   Sable Feil, PA-C  glipiZIDE (GLUCOTROL) 10 MG tablet Take 1 tablet (10 mg total) by mouth 2 (two) times daily. 08/13/19   Sable Feil, PA-C  glipiZIDE (GLUCOTROL) 10 MG tablet Take 1 tablet (10  mg total) by mouth in the morning. 09/25/21     glucose blood (ACCU-CHEK COMPACT PLUS) test strip Use as instructed 11/27/18   Lebron Conners D, MD  glucose blood (ACCU-CHEK GUIDE) test strip Use as instructed to check blood sugar daily 10/14/20     glucose blood ([employer] BLOOD GLUCOSE TEST) test strip USE AS INSTRUCTED 11/22/21     glucose blood test strip See admin instructions. 10/16/20     ibuprofen (ADVIL) 600 MG tablet Take 1 tablet (600 mg total) by mouth every 6 (six) hours as needed. 02/08/21   Melynda Ripple, MD  ipratropium (ATROVENT) 0.06 % nasal spray Place 2 sprays into both  nostrils 4 (four) times daily. 02/01/21   Margarette Canada, NP  ketoconazole (NIZORAL) 2 % shampoo Apply topically 3 (three) times a week. 09/25/21     lisinopril (ZESTRIL) 10 MG tablet Take 1 tablet (10 mg total) by mouth daily. 08/13/19   Sable Feil, PA-C  lisinopril (ZESTRIL) 5 MG tablet Take 1 tablet (5 mg total) by mouth nightly. 10/12/20     lisinopril (ZESTRIL) 5 MG tablet Take 1 tablet (5 mg total) by mouth nightly. 09/25/21     Melatonin 10 MG TABS Take by mouth.    [provider]  meloxicam (MOBIC) 15 MG tablet Take 15 mg by mouth daily. 07/31/19   [provider]  metFORMIN (GLUCOPHAGE) 1000 MG tablet Take 1 tablet (1,000 mg total) by mouth 2 (two) times daily. 08/13/19   Sable Feil, PA-C  metFORMIN (GLUCOPHAGE) 1000 MG tablet Take 1 tablet (1,000 mg total) by mouth in the morning and 1 tablet (1,000 mg total) in the evening. Take with meals. 09/25/21     metFORMIN (GLUCOPHAGE) 500 MG tablet Take 1 tablet (500 mg total) by mouth 2 (two) times daily with meals 06/27/20     oseltamivir (TAMIFLU) 75 MG capsule Take 1 capsule (75 mg total) by mouth every 12 (twelve) hours. 02/01/21   Margarette Canada, NP  Semaglutide, 1 MG/DOSE, (OZEMPIC, 1 MG/DOSE,) 4 MG/3ML SOPN Inject 1 mg under the skin every seven (7) days. 09/25/21     Semaglutide, 1 MG/DOSE, (OZEMPIC, 1 MG/DOSE,) 4 MG/3ML SOPN Inject 1 mg under the skin every seven (7) days. 10/18/21     Semaglutide, 2 MG/DOSE, (OZEMPIC, 2 MG/DOSE,) 8 MG/3ML SOPN Inject 2 mg under the skin every seven (7) days. 11/14/21     Semaglutide,0.25 or 0.5MG/DOS, (OZEMPIC, 0.25 OR 0.5 MG/DOSE,) 2 MG/1.5ML SOPN Take 0.56m once per week 10/12/20     sitaGLIPtin (JANUVIA) 50 MG tablet Take 1 tablet (50 mg total) by mouth daily. Patient taking differently: Take 50 mg by mouth daily. Twice a day 08/13/19   SSable Feil PA-C  sitaGLIPtin (JANUVIA) 50 MG tablet Take 1 tablet (50 mg total) by mouth daily. 08/13/19   SSable Feil PA-C    Family  History History reviewed. No pertinent family history.  Social History Social History   Tobacco Use   Smoking status: Never   Smokeless tobacco: Never  Vaping Use   Vaping Use: Never used  Substance Use Topics   Alcohol use: Not Currently    Comment: 1-2 drinks per year on average   Drug use: Not Currently     Allergies   Zithromax [azithromycin]   Review of Systems Review of Systems As noted above.  Other systems reviewed and found to be negative.    Physical Exam Triage Vital Signs ED Triage Vitals  Enc Vitals Group  BP 01/29/22 1119 135/85     Pulse Rate 01/29/22 1119 100     Resp 01/29/22 1119 18     Temp 01/29/22 1119 98.5 F (36.9 C)     Temp Source 01/29/22 1119 Oral     SpO2 01/29/22 1119 98 %     Weight --      Height --      Head Circumference --      Peak Flow --      Pain Score 01/29/22 1123 0     Pain Loc --      Pain Edu? --      Excl. in [place]? --    No data found.  Updated Vital Signs BP 135/85 (BP Location: Right Arm)   Pulse 100   Temp 98.5 F (36.9 C) (Oral)   Resp 18   SpO2 98%   Visual Acuity Right Eye Distance:   Left Eye Distance:   Bilateral Distance:    Right Eye Near:   Left Eye Near:    Bilateral Near:     Physical Exam Constitutional:      Appearance: Normal appearance.  HENT:     Head: Normocephalic and atraumatic.     Right Ear: Tympanic membrane and ear canal normal.     Left Ear: Tympanic membrane and ear canal normal.     Nose: Nose normal.     Mouth/Throat:     Mouth: Mucous membranes are moist.  Eyes:     Extraocular Movements: Extraocular movements intact.     Pupils: Pupils are equal, round, and reactive to light.  Cardiovascular:     Rate and Rhythm: Normal rate and regular rhythm.     Heart sounds: Normal heart sounds.  Pulmonary:     Effort: Pulmonary effort is normal.     Breath sounds: Normal breath sounds. No wheezing or rhonchi.  Musculoskeletal:     Cervical back: Normal range of motion.   Lymphadenopathy:     Cervical: No cervical adenopathy.  Neurological:     General: No focal deficit present.     Mental Status: He is alert and oriented to person, place, and time.      UC Treatments / Results  Labs (all labs ordered are listed, but only abnormal results are displayed) Labs Reviewed  RESP PANEL BY RT-PCR (RSV, FLU A&B, COVID)  [id number] - Abnormal; Notable for the following components:      Result Value   SARS Coronavirus 2 by RT PCR POSITIVE (*)    All other components within normal limits    EKG   Radiology No results found.  Procedures Procedures (including critical care time)  Medications Ordered in UC Medications - No data to display  Initial Impression / Assessment and Plan / UC Course  I have reviewed the triage vital signs and the nursing notes.  Pertinent labs & imaging results that were available during my care of the patient were reviewed by me and considered in my medical decision making (see chart for details).    Patient presents with flulike symptoms that began Saturday.  Patient reports temperature 103 at home on Saturday.  Daughter was sick before that and his wife had similar symptoms as well.  Patient also reports his boss was out with COVID at the end of last week as well.  Patient been taking NyQuil without much improvement.  Patient has not had the flu shot this year.  Patient positive for COVID.  Will give him prescription  for Mobic.  We are as well as Tessalon for cough.  COVID precautions given.  Final Clinical Impressions(s) / UC Diagnoses   Final diagnoses:  COVID     Discharge Instructions      - COVID test was positive. You will have to quarantine for 5 days from the start of your symptoms.  After 5 days you can break quarantine if your symptoms have improved and you have not had a fever for 24 hours without taking Tylenol or ibuprofen.  -molnupiravir: Take 4 capsules by mouth twice a day for 5 days  -Use  over-the-counter Tylenol and ibuprofen as needed for body aches and fever.  -Use the Tessalon Perles during the day as needed for cough and the Promethazine DM cough syrup at nighttime as will make you drowsy.  -If you develop any increased shortness of breath-especially at rest, you are unable to speak in full sentences, or is a late sign your lips are turning blue you need to go the ER for evaluation.      ED Prescriptions     Medication Sig Dispense Auth. Provider   molnupiravir EUA (LAGEVRIO) 200 MG CAPS capsule Take 4 capsules (800 mg total) by mouth 2 (two) times daily for 5 days. 40 capsule Luvenia Redden, PA-C   benzonatate (TESSALON) 100 MG capsule Take 1 capsule (100 mg total) by mouth every 8 (eight) hours. 21 capsule Luvenia Redden, PA-C      PDMP not reviewed this encounter.   Luvenia Redden, PA-C 01/29/22 1226

## 2022-01-29 NOTE — Discharge Instructions (Addendum)
-   COVID test was positive. You will have to quarantine for 5 days from the start of your symptoms.  After 5 days you can break quarantine if your symptoms have improved and you have not had a fever for 24 hours without taking Tylenol or ibuprofen.  -molnupiravir: Take 4 capsules by mouth twice a day for 5 days  -Use over-the-counter Tylenol and ibuprofen as needed for body aches and fever.  -Use the Tessalon Perles during the day as needed for cough and the Promethazine DM cough syrup at nighttime as will make you drowsy.  -If you develop any increased shortness of breath-especially at rest, you are unable to speak in full sentences, or is a late sign your lips are turning blue you need to go the ER for evaluation.

## 2022-01-29 NOTE — ED Triage Notes (Signed)
Pt reports headache, nasal congestion, cough fever of 103. Chills since Saturday. Took nyquil but no relief.

## 2022-01-30 ENCOUNTER — Other Ambulatory Visit: Payer: Self-pay

## 2022-02-02 ENCOUNTER — Other Ambulatory Visit: Payer: Self-pay

## 2022-02-23 ENCOUNTER — Other Ambulatory Visit: Payer: Self-pay

## 2022-02-23 DIAGNOSIS — Z021 Encounter for pre-employment examination: Secondary | ICD-10-CM

## 2022-02-23 NOTE — Progress Notes (Signed)
Presents for pre-employment drug screen.  [facility] Acct #:  [id number] [facility] Specimen #:  [id number]  Rapid drug screen results = Negative  AMD

## 2022-03-13 ENCOUNTER — Other Ambulatory Visit: Payer: Self-pay

## 2022-04-03 ENCOUNTER — Other Ambulatory Visit: Payer: Self-pay

## 2022-04-03 MED ORDER — GLIPIZIDE 10 MG PO TABS
ORAL_TABLET | ORAL | 0 refills | Status: DC
  Filled 2022-04-03: qty 90, 90d supply, fill #0

## 2022-06-28 ENCOUNTER — Other Ambulatory Visit: Payer: Self-pay

## 2022-06-28 ENCOUNTER — Encounter: Payer: Self-pay | Admitting: Nurse Practitioner

## 2022-06-28 ENCOUNTER — Ambulatory Visit (INDEPENDENT_AMBULATORY_CARE_PROVIDER_SITE_OTHER): Payer: 59 | Admitting: Nurse Practitioner

## 2022-06-28 VITALS — BP 132/84 | HR 96 | Temp 98.2°F | Resp 16 | Ht 69.0 in | Wt 188.0 lb

## 2022-06-28 DIAGNOSIS — E1165 Type 2 diabetes mellitus with hyperglycemia: Secondary | ICD-10-CM

## 2022-06-28 DIAGNOSIS — Z1159 Encounter for screening for other viral diseases: Secondary | ICD-10-CM | POA: Diagnosis not present

## 2022-06-28 DIAGNOSIS — E7849 Other hyperlipidemia: Secondary | ICD-10-CM

## 2022-06-28 DIAGNOSIS — I10 Essential (primary) hypertension: Secondary | ICD-10-CM

## 2022-06-28 DIAGNOSIS — Z7689 Persons encountering health services in other specified circumstances: Secondary | ICD-10-CM | POA: Diagnosis not present

## 2022-06-28 DIAGNOSIS — Z1211 Encounter for screening for malignant neoplasm of colon: Secondary | ICD-10-CM

## 2022-06-28 DIAGNOSIS — Z114 Encounter for screening for human immunodeficiency virus [HIV]: Secondary | ICD-10-CM

## 2022-06-28 MED ORDER — GLIPIZIDE 10 MG PO TABS
10.0000 mg | ORAL_TABLET | Freq: Every morning | ORAL | 1 refills | Status: DC
Start: 2022-06-28 — End: 2022-08-06
  Filled 2022-06-28: qty 90, 90d supply, fill #0

## 2022-06-28 MED ORDER — DEXCOM G7 SENSOR MISC
1.0000 | 5 refills | Status: DC
Start: 2022-06-28 — End: 2022-09-28
  Filled 2022-06-28: qty 3, 30d supply, fill #0

## 2022-06-28 MED ORDER — LISINOPRIL 5 MG PO TABS
5.0000 mg | ORAL_TABLET | Freq: Every day | ORAL | 1 refills | Status: DC
Start: 2022-06-28 — End: 2022-12-31
  Filled 2022-06-28: qty 90, 90d supply, fill #0
  Filled 2022-10-09: qty 90, 90d supply, fill #1

## 2022-06-28 MED ORDER — TIRZEPATIDE 2.5 MG/0.5ML ~~LOC~~ SOAJ
2.5000 mg | SUBCUTANEOUS | 0 refills | Status: DC
Start: 2022-06-28 — End: 2022-07-19
  Filled 2022-06-28: qty 2, 28d supply, fill #0

## 2022-06-28 MED ORDER — METFORMIN HCL 1000 MG PO TABS
1000.0000 mg | ORAL_TABLET | Freq: Two times a day (BID) | ORAL | 1 refills | Status: DC
Start: 2022-06-28 — End: 2023-01-21
  Filled 2022-06-28: qty 180, 90d supply, fill #0
  Filled 2022-10-09: qty 180, 90d supply, fill #1

## 2022-06-28 MED ORDER — ATORVASTATIN CALCIUM 10 MG PO TABS
10.0000 mg | ORAL_TABLET | Freq: Every day | ORAL | 1 refills | Status: DC
Start: 2022-06-28 — End: 2022-12-31
  Filled 2022-06-28: qty 90, 90d supply, fill #0
  Filled 2022-10-09: qty 90, 90d supply, fill #1

## 2022-06-28 NOTE — Assessment & Plan Note (Signed)
Restart atorvastatin 10 mg daily.

## 2022-06-28 NOTE — Progress Notes (Signed)
BP 132/84   Pulse 96   Temp 98.2 F (36.8 C) (Oral)   Resp 16   Ht 5\' 9"  (1.753 m)   Wt 188 lb (85.3 kg)   SpO2 98%   BMI 27.76 kg/m    Subjective:    Patient ID: Eric Carr, male    DOB: 1975/10/05, 47 y.o.   MRN: [id number]  HPI: Eric Carr is a 47 y.o. male  Chief Complaint  Patient presents with   Establish Care   Diabetes   Hypertension   Hyperlipidemia   Establish care: his last physical was over a year.  Medical history includes htn, diabetes, hyperlipidemia.  Family history includes diabetes, cancer.  Health Maintenance due for labs, colonoscopy.   HTN: His blood pressure today is 132/84.  He was previously on lisinopril 5 mg daily. He denies any chest pain, shortness of breath, headache.     Diabetes: last A1C was 7.0 on 09/25/2021. He has been out of his medication for 4 weeks. He was previously on glipizide, metformin,  ozempic and januvia. He says he was started to not tolerate the ozempic it was giving him gas.  He says that even before he was off the medication his blood sugar was still running in the 200s.  He says recently his blood sugar has been running High and 338. Discussed that with his blood sugars still being elevated he may need to start insulin. Patient would like to avoid that if he can.  He is willing to try moujaro. Will start treatment and get labs.  He would also like to use a dexcom.    Hyperlipidemia: his las LDL was 46 on 09/25/2021. He has been taking atorvastatin 10 mg daily.  He denies any myalgia. He has been out of medication for about a month.  Will restart.   Relevant past medical, surgical, family and social history reviewed and updated as indicated. Interim medical history since our last visit reviewed. Allergies and medications reviewed and updated.  Review of Systems  Constitutional: Negative for fever or weight change.  Respiratory: Negative for cough and shortness of breath.   Cardiovascular: Negative for chest pain or  palpitations.  Gastrointestinal: Negative for abdominal pain, no bowel changes.  Musculoskeletal: Negative for gait problem or joint swelling.  Skin: Negative for rash.  Neurological: Negative for dizziness or headache.  No other specific complaints in a complete review of systems (except as listed in HPI above).      Objective:    BP 132/84   Pulse 96   Temp 98.2 F (36.8 C) (Oral)   Resp 16   Ht 5\' 9"  (1.753 m)   Wt 188 lb (85.3 kg)   SpO2 98%   BMI 27.76 kg/m   Wt Readings from Last 3 Encounters:  06/28/22 188 lb (85.3 kg)  02/08/21 188 lb (85.3 kg)  02/12/20 193 lb 2 oz (87.6 kg)    Physical Exam  Constitutional: Patient appears well-developed and well-nourished.  No distress.  HEENT: head atraumatic, normocephalic, pupils equal and reactive to light, neck supple Cardiovascular: Normal rate, regular rhythm and normal heart sounds.  No murmur heard. No BLE edema. Pulmonary/Chest: Effort normal and breath sounds normal. No respiratory distress. Abdominal: Soft.  There is no tenderness. Psychiatric: Patient has a normal mood and affect. behavior is normal. Judgment and thought content normal.      Assessment & Plan:   Problem List Items Addressed This Visit       Cardiovascular and  Mediastinum   Hypertension    Restart lisinopril 5 mg daily.        Relevant Medications   atorvastatin (LIPITOR) 10 MG tablet   lisinopril (ZESTRIL) 5 MG tablet   Other Relevant Orders   CBC with Differential/Platelet   COMPLETE METABOLIC PANEL WITH GFR     Endocrine   Diabetes (HCC)    Restart glipizide 10 mg daily, metformin 1000 mg two times a day and start mounjaro 2.5 mg weekly.  Had a conversation regarding blood sugar goals and that if he continues to run high with medication may need to start insulin.        Relevant Medications   atorvastatin (LIPITOR) 10 MG tablet   glipiZIDE (GLUCOTROL) 10 MG tablet   lisinopril (ZESTRIL) 5 MG tablet   metFORMIN (GLUCOPHAGE) 1000 MG  tablet   Continuous Glucose Sensor (DEXCOM G7 SENSOR) MISC   tirzepatide (MOUNJARO) 2.5 MG/0.5ML Pen   Other Relevant Orders   HM Diabetes Foot Exam (Completed)   COMPLETE METABOLIC PANEL WITH GFR   Hemoglobin A1c   Microalbumin / creatinine urine ratio     Other   Other hyperlipidemia - Primary    Restart atorvastatin 10 mg daily.       Relevant Medications   atorvastatin (LIPITOR) 10 MG tablet   lisinopril (ZESTRIL) 5 MG tablet   Other Relevant Orders   COMPLETE METABOLIC PANEL WITH GFR   Lipid panel   Other Visit Diagnoses     Encounter for hepatitis C screening test for low risk patient       Relevant Orders   Hepatitis C antibody   Screening for HIV without presence of risk factors       Relevant Orders   HIV Antibody (routine testing w rflx)   Colon cancer screening       Relevant Orders   Ambulatory referral to Gastroenterology   Encounter to establish care       schedule cpe        Follow up plan: Return in about 3 months (around 09/28/2022) for follow up.

## 2022-06-28 NOTE — Assessment & Plan Note (Signed)
Restart lisinopril 5 mg daily.

## 2022-06-28 NOTE — Assessment & Plan Note (Signed)
Restart glipizide 10 mg daily, metformin 1000 mg two times a day and start mounjaro 2.5 mg weekly.  Had a conversation regarding blood sugar goals and that if he continues to run high with medication may need to start insulin.

## 2022-06-29 ENCOUNTER — Telehealth: Payer: Self-pay

## 2022-06-29 ENCOUNTER — Other Ambulatory Visit: Payer: Self-pay

## 2022-06-29 DIAGNOSIS — Z1211 Encounter for screening for malignant neoplasm of colon: Secondary | ICD-10-CM

## 2022-06-29 LAB — LIPID PANEL
HDL: 36 mg/dL — ABNORMAL LOW (ref >=40)
Non-HDL Cholesterol (Calc): 138 mg/dL (calc) — ABNORMAL HIGH (ref <130)
Triglycerides: 436 mg/dL — ABNORMAL HIGH (ref <150)

## 2022-06-29 LAB — HEPATITIS C ANTIBODY: Hepatitis C Ab: NONREACTIVE

## 2022-06-29 LAB — COMPLETE METABOLIC PANEL WITH GFR
AG Ratio: 1.6 (calc) (ref 1.0–2.5)
ALT: 24 U/L (ref 9–46)
Alkaline phosphatase (APISO): 93 U/L (ref 36–130)
Creat: 1.37 mg/dL — ABNORMAL HIGH (ref 0.60–1.29)
Glucose, Bld: 514 mg/dL (ref 65–99)

## 2022-06-29 LAB — HEMOGLOBIN A1C: Hgb A1c MFr Bld: 11.7 % of total Hgb — ABNORMAL HIGH (ref <5.7)

## 2022-06-29 MED ORDER — NA SULFATE-K SULFATE-MG SULF 17.5-3.13-1.6 GM/177ML PO SOLN
1.0000 | Freq: Once | ORAL | 0 refills | Status: AC
  Filled 2022-06-29: qty 354, 1d supply, fill #0

## 2022-06-29 NOTE — Telephone Encounter (Signed)
Gastroenterology Pre-Procedure Review  Request Date: 07/18/22 Requesting Physician: Dr. Tobi Bastos  PATIENT REVIEW QUESTIONS: The patient responded to the following health history questions as indicated:    1. Are you having any GI issues? no 2. Do you have a personal history of Polyps? no 3. Do you have a family history of Colon Cancer or Polyps? yes (Paternal grandmother colon cancer, Dad colon polyps) 4. Diabetes Mellitus? IMPORTANT MEDICATION REMINDERS NOTED: -STOP MOUNJARO ON 07/11/22   -STOP METFORMIN ON 07/16/22  -STOP GLIPIZIDE ON 07/17/22   5. Joint replacements in the past 12 months?no 6. Major health problems in the past 3 months?no 7. Any artificial heart valves, MVP, or defibrillator?no    MEDICATIONS & ALLERGIES:    Patient reports the following regarding taking any anticoagulation/antiplatelet therapy:   Plavix, Coumadin, Eliquis, Xarelto, Lovenox, Pradaxa, Brilinta, or Effient? no Aspirin? no  Patient confirms/reports the following medications:  Current Outpatient Medications  Medication Sig Dispense Refill   atorvastatin (LIPITOR) 10 MG tablet Take 1 tablet (10 mg total) by mouth daily. 90 tablet 1   Continuous Glucose Sensor (DEXCOM G7 SENSOR) MISC Use as directed. 3 each 5   glipiZIDE (GLUCOTROL) 10 MG tablet Take 1 tablet (10 mg total) by mouth in the morning. 90 tablet 1   glucose blood test strip See admin instructions. (Patient not taking: Reported on 06/28/2022) 100 each 11   lisinopril (ZESTRIL) 5 MG tablet Take 1 tablet (5 mg total) by mouth at bedtime. 90 tablet 1   Melatonin 10 MG TABS Take by mouth.     metFORMIN (GLUCOPHAGE) 1000 MG tablet Take 1 tablet (1,000 mg total) by mouth 2 (two) times daily with a meal. 180 tablet 1   tirzepatide (MOUNJARO) 2.5 MG/0.5ML Pen Inject 2.5 mg into the skin once a week. 2 mL 0   No current facility-administered medications for this visit.    Patient confirms/reports the following allergies:  Allergies  Allergen  Reactions   Zithromax [Azithromycin] Dermatitis    No orders of the defined types were placed in this encounter.   AUTHORIZATION INFORMATION Primary Insurance: 1D#: Group #:  Secondary Insurance: 1D#: Group #:  SCHEDULE INFORMATION: Date: 07/18/22 Time: Location: [facility]

## 2022-06-30 LAB — HIV ANTIBODY (ROUTINE TESTING W REFLEX): HIV 1&2 Ab, 4th Generation: NONREACTIVE

## 2022-06-30 LAB — CBC WITH DIFFERENTIAL/PLATELET
Absolute Monocytes: 624 cells/uL (ref 200–950)
Basophils Absolute: 135 cells/uL (ref 0–200)
Basophils Relative: 1.3 %
Eosinophils Absolute: 374 cells/uL (ref 15–500)
Eosinophils Relative: 3.6 %
HCT: 43.5 % (ref 38.5–50.0)
Hemoglobin: 14 g/dL (ref 13.2–17.1)
Lymphs Abs: 3037 cells/uL (ref 850–3900)
MCH: 26.8 pg — ABNORMAL LOW (ref 27.0–33.0)
MCHC: 32.2 g/dL (ref 32.0–36.0)
MCV: 83.3 fL (ref 80.0–100.0)
MPV: 10.3 fL (ref 7.5–12.5)
Monocytes Relative: 6 %
Neutro Abs: 6230 cells/uL (ref 1500–7800)
Neutrophils Relative %: 59.9 %
Platelets: 316 10*3/uL (ref 140–400)
RBC: 5.22 10*6/uL (ref 4.20–5.80)
RDW: 13.5 % (ref 11.0–15.0)
Total Lymphocyte: 29.2 %
WBC: 10.4 10*3/uL (ref 3.8–10.8)

## 2022-06-30 LAB — COMPLETE METABOLIC PANEL WITH GFR
AST: 20 U/L (ref 10–40)
Albumin: 4.1 g/dL (ref 3.6–5.1)
BUN/Creatinine Ratio: 15 (calc) (ref 6–22)
BUN: 20 mg/dL (ref 7–25)
CO2: 25 mmol/L (ref 20–32)
Calcium: 9.5 mg/dL (ref 8.6–10.3)
Chloride: 98 mmol/L (ref 98–110)
Globulin: 2.6 g/dL (calc) (ref 1.9–3.7)
Potassium: 4.6 mmol/L (ref 3.5–5.3)
Sodium: 133 mmol/L — ABNORMAL LOW (ref 135–146)
Total Bilirubin: 0.3 mg/dL (ref 0.2–1.2)
Total Protein: 6.7 g/dL (ref 6.1–8.1)
eGFR: 64 mL/min/{1.73_m2} (ref >=60)

## 2022-06-30 LAB — MICROALBUMIN / CREATININE URINE RATIO
Creatinine, Urine: 30 mg/dL (ref 20–320)
Microalb, Ur: <0.2 mg/dL

## 2022-06-30 LAB — LIPID PANEL
Cholesterol: 174 mg/dL (ref <200)
Total CHOL/HDL Ratio: 4.8 (calc) (ref <5.0)

## 2022-06-30 LAB — HEMOGLOBIN A1C
Mean Plasma Glucose: 289 mg/dL
eAG (mmol/L): 16 mmol/L

## 2022-07-02 ENCOUNTER — Encounter: Payer: Self-pay | Admitting: Nurse Practitioner

## 2022-07-06 ENCOUNTER — Encounter: Payer: Self-pay | Admitting: Nurse Practitioner

## 2022-07-06 ENCOUNTER — Other Ambulatory Visit: Payer: Self-pay

## 2022-07-06 ENCOUNTER — Ambulatory Visit (INDEPENDENT_AMBULATORY_CARE_PROVIDER_SITE_OTHER): Payer: 59 | Admitting: Nurse Practitioner

## 2022-07-06 VITALS — BP 130/82 | HR 99 | Temp 98.2°F | Resp 18 | Ht 69.0 in | Wt 193.2 lb

## 2022-07-06 DIAGNOSIS — Z794 Long term (current) use of insulin: Secondary | ICD-10-CM | POA: Diagnosis not present

## 2022-07-06 DIAGNOSIS — E1165 Type 2 diabetes mellitus with hyperglycemia: Secondary | ICD-10-CM

## 2022-07-06 MED ORDER — FREESTYLE LIBRE 3 SENSOR MISC
1.0000 | 5 refills | Status: DC
Start: 2022-07-06 — End: 2023-04-30
  Filled 2022-07-06: qty 2, 28d supply, fill #0
  Filled 2022-07-27: qty 2, 28d supply, fill #1
  Filled 2022-08-23: qty 2, 28d supply, fill #2
  Filled 2022-09-20: qty 2, 28d supply, fill #3
  Filled 2022-10-15: qty 2, 28d supply, fill #4

## 2022-07-06 MED ORDER — INSULIN PEN NEEDLE 31G X 5 MM MISC
1.0000 | Freq: Every day | 0 refills | Status: DC
Start: 2022-07-06 — End: 2023-04-30
  Filled 2022-07-06: qty 100, 90d supply, fill #0

## 2022-07-06 MED ORDER — TRESIBA FLEXTOUCH 100 UNIT/ML ~~LOC~~ SOPN
12.0000 [IU] | PEN_INJECTOR | Freq: Every day | SUBCUTANEOUS | 1 refills | Status: DC
Start: 2022-07-06 — End: 2022-08-06
  Filled 2022-07-06: qty 15, 90d supply, fill #0

## 2022-07-06 NOTE — Progress Notes (Signed)
BP 130/82   Pulse 99   Temp 98.2 F (36.8 C) (Oral)   Resp 18   Ht 5\' 9"  (1.753 m)   Wt 193 lb 3.2 oz (87.6 kg)   SpO2 98%   BMI 28.53 kg/m    Subjective:    Patient ID: Eric Carr, male    DOB: 1975/04/05, 47 y.o.   MRN: [id number]  HPI: Eric Carr is a 47 y.o. male  Chief Complaint  Patient presents with   Diabetes    1 week follow up   Diabetes: previous A1C was 7.0 on 09/25/2021. He had been out of his medication for 4 weeks. He was previously on glipizide, metformin,  ozempic and januvia. He says he was started to not tolerate the ozempic it was giving him gas.  He says that even before he was off the medication his blood sugar was still running in the 200s.  He says recently his blood sugar has been running High and 338. Discussed that with his blood sugars still being elevated he may need to start insulin. Patient would like to avoid that if he can.  He is willing to try moujaro.  He would also like to use a dexcom.  Restarted his medication at last office visit.  However when we got his labs back his A1C was 11.7 and his glucose was 514.  Started patient on tresiba 10 units daily. He then reached out on 5/6, stating that his blood sugar was still running around 190. Increased to 12 units daily.  Today patient reports he is feeling much better.  His blood sugars have been 82-180. He says that he is doing so much better.  Will continue with current treatment plan. He did want the [employer] would not cover the dexcom.    Relevant past medical, surgical, family and social history reviewed and updated as indicated. Interim medical history since our last visit reviewed. Allergies and medications reviewed and updated.  Review of Systems  Constitutional: Negative for fever or weight change.  Respiratory: Negative for cough and shortness of breath.   Cardiovascular: Negative for chest pain or palpitations.  Gastrointestinal: Negative for abdominal pain, no bowel  changes.  Musculoskeletal: Negative for gait problem or joint swelling.  Skin: Negative for rash.  Neurological: Negative for dizziness or headache.  No other specific complaints in a complete review of systems (except as listed in HPI above).      Objective:    BP 130/82   Pulse 99   Temp 98.2 F (36.8 C) (Oral)   Resp 18   Ht 5\' 9"  (1.753 m)   Wt 193 lb 3.2 oz (87.6 kg)   SpO2 98%   BMI 28.53 kg/m   Wt Readings from Last 3 Encounters:  07/06/22 193 lb 3.2 oz (87.6 kg)  06/28/22 188 lb (85.3 kg)  02/08/21 188 lb (85.3 kg)    Physical Exam  Constitutional: Patient appears well-developed and well-nourished.  No distress.  HEENT: head atraumatic, normocephalic, pupils equal and reactive to light, neck supple Cardiovascular: Normal rate, regular rhythm and normal heart sounds.  No murmur heard. No BLE edema. Pulmonary/Chest: Effort normal and breath sounds normal. No respiratory distress. Abdominal: Soft.  There is no tenderness. Psychiatric: Patient has a normal mood and affect. behavior is normal. Judgment and thought content normal.      Assessment & Plan:   Problem List Items Addressed This Visit       Endocrine   Diabetes (HCC) -  Primary   Relevant Medications   insulin degludec (TRESIBA FLEXTOUCH) 100 UNIT/ML FlexTouch Pen   Insulin Pen Needle 32G X 6 MM MISC   Continuous Glucose Sensor (FREESTYLE LIBRE 3 SENSOR) MISC   Insulin Degludec (TRESIBA FLEXTOUCH [place])     Follow up plan: Return for has appointment scheduled.

## 2022-07-10 DIAGNOSIS — G8929 Other chronic pain: Secondary | ICD-10-CM | POA: Diagnosis not present

## 2022-07-10 DIAGNOSIS — M25562 Pain in left knee: Secondary | ICD-10-CM | POA: Diagnosis not present

## 2022-07-15 DIAGNOSIS — M2240 Chondromalacia patellae, unspecified knee: Secondary | ICD-10-CM | POA: Insufficient documentation

## 2022-07-15 DIAGNOSIS — H5200 Hypermetropia, unspecified eye: Secondary | ICD-10-CM | POA: Insufficient documentation

## 2022-07-15 DIAGNOSIS — H52229 Regular astigmatism, unspecified eye: Secondary | ICD-10-CM | POA: Insufficient documentation

## 2022-07-17 ENCOUNTER — Encounter: Payer: Self-pay | Admitting: Gastroenterology

## 2022-07-18 ENCOUNTER — Ambulatory Visit: Payer: 59

## 2022-07-18 ENCOUNTER — Other Ambulatory Visit: Payer: Self-pay | Admitting: Nurse Practitioner

## 2022-07-18 ENCOUNTER — Encounter
Admission: RE | Disposition: A | Discharge status: Home / Self Care | Payer: Self-pay | Source: Ambulatory Visit | Attending: Gastroenterology

## 2022-07-18 ENCOUNTER — Ambulatory Visit
Admission: RE | Admit: 2022-07-18 | Discharge: 2022-07-18 | Disposition: A | Discharge status: Home / Self Care | Payer: 59 | Source: Ambulatory Visit | Attending: Gastroenterology | Admitting: Gastroenterology

## 2022-07-18 ENCOUNTER — Encounter: Payer: Self-pay | Admitting: Gastroenterology

## 2022-07-18 DIAGNOSIS — D122 Benign neoplasm of ascending colon: Secondary | ICD-10-CM | POA: Diagnosis not present

## 2022-07-18 DIAGNOSIS — D120 Benign neoplasm of cecum: Secondary | ICD-10-CM | POA: Insufficient documentation

## 2022-07-18 DIAGNOSIS — E1165 Type 2 diabetes mellitus with hyperglycemia: Secondary | ICD-10-CM

## 2022-07-18 DIAGNOSIS — I10 Essential (primary) hypertension: Secondary | ICD-10-CM | POA: Insufficient documentation

## 2022-07-18 DIAGNOSIS — Z83719 Family history of colon polyps, unspecified: Secondary | ICD-10-CM | POA: Insufficient documentation

## 2022-07-18 DIAGNOSIS — D126 Benign neoplasm of colon, unspecified: Secondary | ICD-10-CM

## 2022-07-18 DIAGNOSIS — Z1211 Encounter for screening for malignant neoplasm of colon: Secondary | ICD-10-CM

## 2022-07-18 DIAGNOSIS — E119 Type 2 diabetes mellitus without complications: Secondary | ICD-10-CM | POA: Diagnosis not present

## 2022-07-18 DIAGNOSIS — D124 Benign neoplasm of descending colon: Secondary | ICD-10-CM | POA: Diagnosis not present

## 2022-07-18 DIAGNOSIS — D123 Benign neoplasm of transverse colon: Secondary | ICD-10-CM | POA: Diagnosis not present

## 2022-07-18 DIAGNOSIS — K635 Polyp of colon: Secondary | ICD-10-CM | POA: Diagnosis not present

## 2022-07-18 HISTORY — PX: COLONOSCOPY WITH PROPOFOL: SHX5780

## 2022-07-18 LAB — GLUCOSE, CAPILLARY: Glucose-Capillary: 128 mg/dL — ABNORMAL HIGH (ref 70–99)

## 2022-07-18 SURGERY — COLONOSCOPY WITH PROPOFOL
Anesthesia: General

## 2022-07-18 MED ORDER — DEXMEDETOMIDINE HCL IN NACL 80 MCG/20ML IV SOLN
INTRAVENOUS | Status: DC | PRN
  Administered 2022-07-18: 12 ug via INTRAVENOUS
  Administered 2022-07-18: 8 ug via INTRAVENOUS

## 2022-07-18 MED ORDER — PROPOFOL 500 MG/50ML IV EMUL
INTRAVENOUS | Status: DC | PRN
  Administered 2022-07-18: 150 ug/kg/min via INTRAVENOUS

## 2022-07-18 MED ORDER — PROPOFOL 10 MG/ML IV BOLUS
INTRAVENOUS | Status: DC | PRN
  Administered 2022-07-18: 20 mg via INTRAVENOUS
  Administered 2022-07-18: 80 mg via INTRAVENOUS

## 2022-07-18 MED ORDER — SODIUM CHLORIDE 0.9 % IV SOLN: INTRAVENOUS | Status: DC

## 2022-07-18 MED ORDER — LIDOCAINE HCL (CARDIAC) PF 100 MG/5ML IV SOSY
PREFILLED_SYRINGE | INTRAVENOUS | Status: DC | PRN
  Administered 2022-07-18: 50 mg via INTRAVENOUS

## 2022-07-18 NOTE — Anesthesia Postprocedure Evaluation (Signed)
Anesthesia Post Note  Patient: Eric Carr  Procedure(s) Performed: COLONOSCOPY WITH PROPOFOL  Patient location during evaluation: PACU Anesthesia Type: General Level of consciousness: awake and alert Pain management: pain level controlled Vital Signs Assessment: post-procedure vital signs reviewed and stable Respiratory status: spontaneous breathing, nonlabored ventilation and respiratory function stable Cardiovascular status: blood pressure returned to baseline and stable Postop Assessment: no apparent nausea or vomiting Anesthetic complications: no   No notable events documented.   Last Vitals:  Vitals:   07/18/22 1354 07/18/22 1404  BP: 102/62 100/60  Pulse:    Resp: 16 17  Temp:    SpO2:      Last Pain:  Vitals:   07/18/22 1404  TempSrc:   PainSc: 0-No pain                 Foye Deer

## 2022-07-18 NOTE — Op Note (Signed)
[facility] Gastroenterology Patient Name: Eric Carr Procedure Date: 07/18/2022 1:06 PM MRN: [id number] Account #: [id number] Date of Birth: 28-Nov-1975 Admit Type: Outpatient Age: 47 Room: [facility] ENDO ROOM 3 Gender: Male Note Status: Finalized Instrument Name: Prentice Docker [id number] Procedure:             Colonoscopy Indications:           Screening for colorectal malignant neoplasm Providers:             Wyline Mood MD, MD Referring MD:          Rudolpho Sevin. Zane Herald (Referring MD) Medicines:             Monitored Anesthesia Care Complications:         No immediate complications. Procedure:             Pre-Anesthesia Assessment:                        - Prior to the procedure, a History and Physical was                         performed, and patient medications, allergies and                         sensitivities were reviewed. The patient's tolerance                         of previous anesthesia was reviewed.                        - The risks and benefits of the procedure and the                         sedation options and risks were discussed with the                         patient. All questions were answered and informed                         consent was obtained.                        - ASA Grade Assessment: II - A patient with mild                         systemic disease.                        After obtaining informed consent, the colonoscope was                         passed under direct vision. Throughout the procedure,                         the patient's blood pressure, pulse, and oxygen                         saturations were monitored continuously. The                         Colonoscope was introduced  through the anus and                         advanced to the the cecum, identified by the                         appendiceal orifice. The colonoscopy was performed                         with ease. The patient tolerated the procedure well.                          The quality of the bowel preparation was excellent.                         The ileocecal valve, appendiceal orifice, and rectum                         were photographed. Findings:      The perianal and digital rectal examinations were normal.      Three sessile polyps were found in the descending colon, transverse       colon and cecum. The polyps were 5 to 6 mm in size. These polyps were       removed with a cold snare. Resection and retrieval were complete.      Three sessile polyps were found in the ascending colon. The polyps were       4 to 5 mm in size. These polyps were removed with a cold snare.       Resection and retrieval were complete.      The exam was otherwise without abnormality on direct and retroflexion       views. Impression:            - Three 5 to 6 mm polyps in the descending colon, in                         the transverse colon and in the cecum, removed with a                         cold snare. Resected and retrieved.                        - Three 4 to 5 mm polyps in the ascending colon,                         removed with a cold snare. Resected and retrieved.                        - The examination was otherwise normal on direct and                         retroflexion views. Recommendation:        - Discharge patient to home (with escort).                        - Resume previous diet.                        -  Continue present medications.                        - Await pathology results.                        - Repeat colonoscopy in 3 years for surveillance based                         on pathology results. Procedure Code(s):     --- Professional ---                        [phone], Colonoscopy, flexible; with removal of                         tumor(s), polyp(s), or other lesion(s) by snare                         technique Diagnosis Code(s):     --- Professional ---                        Z12.11, Encounter for screening for  malignant neoplasm                         of colon                        D12.4, Benign neoplasm of descending colon                        D12.3, Benign neoplasm of transverse colon (hepatic                         flexure or splenic flexure)                        D12.1, Benign neoplasm of appendix                        D12.2, Benign neoplasm of ascending colon CPT copyright 2022 American Medical Association. All rights reserved. The codes documented in this report are preliminary and upon coder review may  be revised to meet current compliance requirements. Wyline Mood, MD Wyline Mood MD, MD 07/18/2022 1:32:09 PM This report has been signed electronically. Number of Addenda: 0 Note Initiated On: 07/18/2022 1:06 PM Scope Withdrawal Time: 0 hours 12 minutes 38 seconds  Total Procedure Duration: 0 hours 14 minutes 26 seconds  Estimated Blood Loss:  Estimated blood loss: none.      [facility]

## 2022-07-18 NOTE — Anesthesia Preprocedure Evaluation (Signed)
Anesthesia Evaluation  Patient identified by MRN, date of birth, ID band Patient awake    Reviewed: Allergy & Precautions, NPO status , Patient's Chart, lab work & pertinent test results  History of Anesthesia Complications Negative for: history of anesthetic complications  Airway Mallampati: III  TM Distance: <3 FB Neck ROM: full    Dental  (+) Chipped   Pulmonary neg pulmonary ROS, neg shortness of breath   Pulmonary exam normal        Cardiovascular Exercise Tolerance: Good hypertension, (-) angina Normal cardiovascular exam     Neuro/Psych negative neurological ROS  negative psych ROS   GI/Hepatic negative GI ROS, Neg liver ROS,neg GERD  ,,  Endo/Other  diabetes, Type 2    Renal/GU negative Renal ROS  negative genitourinary   Musculoskeletal   Abdominal   Peds  Hematology negative hematology ROS (+)   Anesthesia Other Findings Past Medical History: No date: Diabetes (HCC) No date: Hyperlipidemia No date: Hypertension No date: Hypogonadism male No date: Overweight  Past Surgical History: No date: APPENDECTOMY No date: CHOLECYSTECTOMY No date: KNEE SURGERY; Left No date: TONSILECTOMY, ADENOIDECTOMY, BILATERAL MYRINGOTOMY AND  TUBES; Bilateral No date: TONSILLECTOMY  BMI    Body Mass Index: 26.97 kg/m      Reproductive/Obstetrics negative OB ROS                             Anesthesia Physical Anesthesia Plan  ASA: 3  Anesthesia Plan: General   Post-op Pain Management:    Induction: Intravenous  PONV Risk Score and Plan: Propofol infusion and TIVA  Airway Management Planned: Natural Airway and Nasal Cannula  Additional Equipment:   Intra-op Plan:   Post-operative Plan:   Informed Consent: I have reviewed the patients History and Physical, chart, labs and discussed the procedure including the risks, benefits and alternatives for the proposed anesthesia with  the patient or authorized representative who has indicated his/her understanding and acceptance.     Dental Advisory Given  Plan Discussed with: Anesthesiologist, CRNA and Surgeon  Anesthesia Plan Comments: (Patient consented for risks of anesthesia including but not limited to:  - adverse reactions to medications - risk of airway placement if required - damage to eyes, teeth, lips or other oral mucosa - nerve damage due to positioning  - sore throat or hoarseness - Damage to heart, brain, nerves, lungs, other parts of body or loss of life  Patient voiced understanding.)       Anesthesia Quick Evaluation

## 2022-07-18 NOTE — Anesthesia Procedure Notes (Signed)
Procedure Name: General with mask airway Date/Time: 07/18/2022 1:12 PM  Performed by: Lily Lovings, CRNAPre-anesthesia Checklist: Patient identified, Emergency Drugs available, Patient being monitored, Timeout performed and Suction available Patient Re-evaluated:Patient Re-evaluated prior to induction Oxygen Delivery Method: Simple face mask Preoxygenation: Pre-oxygenation with 100% oxygen Induction Type: IV induction

## 2022-07-18 NOTE — Transfer of Care (Signed)
Immediate Anesthesia Transfer of Care Note  Patient: Eric Carr  Procedure(s) Performed: COLONOSCOPY WITH PROPOFOL  Patient Location: Endoscopy Unit  Anesthesia Type:General  Level of Consciousness: drowsy  Airway & Oxygen Therapy: Patient Spontanous Breathing  Post-op Assessment: Report given to RN and Post -op Vital signs reviewed and stable  Post vital signs: Reviewed and stable  Last Vitals:  Vitals Value Taken Time  BP    Temp    Pulse    Resp    SpO2      Last Pain:  Vitals:   07/18/22 1236  TempSrc: Temporal  PainSc: 0-No pain         Complications: No notable events documented.

## 2022-07-18 NOTE — H&P (Signed)
Wyline Mood, MD [address], [address], [place], [place], [address] [address], [address], [place], [place], [address] Phone: [phone]  Fax: [phone]  Primary Care Physician:  Berniece Salines, FNP   Pre-Procedure History & Physical: HPI:  Eric Carr is a 47 y.o. male is here for an colonoscopy.   Past Medical History:  Diagnosis Date   Diabetes (HCC)    Hyperlipidemia    Hypertension    Hypogonadism male    Overweight     Past Surgical History:  Procedure Laterality Date   APPENDECTOMY     CHOLECYSTECTOMY     KNEE SURGERY Left    TONSILECTOMY, ADENOIDECTOMY, BILATERAL MYRINGOTOMY AND TUBES Bilateral    TONSILLECTOMY      Prior to Admission medications   Medication Sig Start Date End Date Taking? Authorizing Provider  atorvastatin (LIPITOR) 10 MG tablet Take 1 tablet (10 mg total) by mouth daily. 06/28/22  Yes Berniece Salines, FNP  glipiZIDE (GLUCOTROL) 10 MG tablet Take 1 tablet (10 mg total) by mouth in the morning. 06/28/22  Yes Berniece Salines, FNP  insulin degludec (TRESIBA FLEXTOUCH) 100 UNIT/ML FlexTouch Pen Inject 12 Units into the skin daily. 07/06/22  Yes Berniece Salines, FNP  lisinopril (ZESTRIL) 5 MG tablet Take 1 tablet (5 mg total) by mouth at bedtime. 06/28/22  Yes Berniece Salines, FNP  Continuous Glucose Sensor (DEXCOM G7 SENSOR) MISC Use as directed. Patient not taking: Reported on 07/06/2022 06/28/22   Berniece Salines, FNP  Continuous Glucose Sensor (FREESTYLE LIBRE 3 SENSOR) MISC 1 each by Does not apply route every 14 (fourteen) days. Place 1 sensor on the skin every 14 days. Use to check glucose continuously 07/06/22   Berniece Salines, FNP  glucose blood test strip See admin instructions. Patient not taking: Reported on 06/28/2022 10/16/20     Insulin Degludec (TRESIBA FLEXTOUCH [place]) Inject 10 Units into the skin as directed.    [provider]  Insulin Pen Needle 31G X 5 MM MISC 1 each by Does not apply route daily. 07/06/22   Berniece Salines, FNP  Melatonin 10 MG TABS Take by mouth.    [provider]  metFORMIN (GLUCOPHAGE) 1000 MG tablet Take 1 tablet (1,000 mg total) by mouth 2 (two) times daily with a meal. 06/28/22   Berniece Salines, FNP  tirzepatide [facility]) 2.5 MG/0.5ML Pen Inject 2.5 mg into the skin once a week. 06/28/22   Berniece Salines, FNP    Allergies as of 06/29/2022 - Review Complete 06/29/2022  Allergen Reaction Noted   Zithromax [azithromycin] Dermatitis 11/26/2018    History reviewed. No pertinent family history.  Social History   Socioeconomic History   Marital status: Married    Spouse name: Not on file   Number of children: 1   Years of education: Not on file   Highest education level: Not on file  Occupational History   Not on file  Tobacco Use   Smoking status: Never   Smokeless tobacco: Never  Vaping Use   Vaping Use: Never used  Substance and Sexual Activity   Alcohol use: Not Currently    Comment: 1-2 drinks per year on average   Drug use: Not Currently   Sexual activity: Yes  Other Topics Concern   Not on file  Social History Narrative   Not on file   Social Determinants of Health   Financial Resource Strain: Low Risk  (07/05/2022)   Overall  Financial Resource Strain (CARDIA)    Difficulty of Paying Living Expenses: Not hard at all  Food Insecurity: No Food Insecurity (07/05/2022)   Hunger Vital Sign    Worried About Running Out of Food in the Last Year: Never true    Ran Out of Food in the Last Year: Never true  Transportation Needs: Patient Declined (07/05/2022)   PRAPARE - Transportation    Lack of Transportation (Medical): Patient declined    Lack of Transportation (Non-Medical): Patient declined  Physical Activity: Unknown (07/05/2022)   Exercise Vital Sign    Days of Exercise per Week: Patient declined    Minutes of Exercise per Session: Not on file  Stress: No Stress Concern Present (07/05/2022)   [employer] of Occupational Health - Occupational Stress  Questionnaire    Feeling of Stress : Not at all  Social Connections: Unknown (07/05/2022)   Social Connection and Isolation Panel [NHANES]    Frequency of Communication with Friends and Family: More than three times a week    Frequency of Social Gatherings with Friends and Family: Once a week    Attends Religious Services: More than 4 times per year    Active Member of [employer] or Organizations: Patient declined    Attends Engineer, structural: Not on file    Marital Status: Married  Catering manager Violence: Not on file    Review of Systems: See HPI, otherwise negative ROS  Physical Exam: BP 113/72   Pulse 78   Temp 97.8 F (36.6 C) (Temporal)   Resp 16   Ht 5\' 9"  (1.753 m)   Wt 82.8 kg   SpO2 99%   BMI 26.97 kg/m  General:   Alert,  pleasant and cooperative in NAD Head:  Normocephalic and atraumatic. Neck:  Supple; no masses or thyromegaly. Lungs:  Clear throughout to auscultation, normal respiratory effort.    Heart:  +S1, +S2, Regular rate and rhythm, No edema. Abdomen:  Soft, nontender and nondistended. Normal bowel sounds, without guarding, and without rebound.   Neurologic:  Alert and  oriented x4;  grossly normal neurologically.  Impression/Plan: Eric Carr is here for an colonoscopy to be performed for family history of colon polyps Risks, benefits, limitations, and alternatives regarding  colonoscopy have been reviewed with the patient.  Questions have been answered.  All parties agreeable.   Wyline Mood, MD  07/18/2022, 12:42 PM

## 2022-07-19 ENCOUNTER — Other Ambulatory Visit: Payer: Self-pay | Admitting: Nurse Practitioner

## 2022-07-19 ENCOUNTER — Encounter: Payer: Self-pay | Admitting: Gastroenterology

## 2022-07-19 ENCOUNTER — Other Ambulatory Visit: Payer: Self-pay

## 2022-07-19 DIAGNOSIS — E1165 Type 2 diabetes mellitus with hyperglycemia: Secondary | ICD-10-CM

## 2022-07-19 MED ORDER — TIRZEPATIDE 5 MG/0.5ML ~~LOC~~ SOAJ
5.0000 mg | SUBCUTANEOUS | 0 refills | Status: DC
Start: 2022-07-19 — End: 2022-09-28
  Filled 2022-07-19: qty 2, 28d supply, fill #0
  Filled 2022-08-16: qty 2, 28d supply, fill #1
  Filled 2022-09-17: qty 2, 28d supply, fill #2

## 2022-07-19 NOTE — Telephone Encounter (Signed)
Requested medications are due for refill today.  Unsure  Requested medications are on the active medications list.  yes  Last refill. 06/28/2022 2 mL 0 rf  Future visit scheduled.   yes  Notes to clinic.  Medication not assigned to a protocol. Please review for refill.    Requested Prescriptions  Pending Prescriptions Disp Refills   tirzepatide (MOUNJARO) 2.5 MG/0.5ML Pen 2 mL 0    Sig: Inject 2.5 mg into the skin once a week.     Off-Protocol Failed - 07/18/2022  6:28 PM      Failed - Medication not assigned to a protocol, review manually.      Passed - Valid encounter within last 12 months    Recent Outpatient Visits           1 week ago Type 2 diabetes mellitus with hyperglycemia, without long-term current use of insulin [facility])   [facility] Berniece Salines, FNP   3 weeks ago Other hyperlipidemia   [facility] Berniece Salines, FNP       Future Appointments             In 2 months Zane Herald, Rudolpho Sevin, FNP [facility], [facility]

## 2022-07-24 ENCOUNTER — Other Ambulatory Visit: Payer: Self-pay

## 2022-07-25 ENCOUNTER — Encounter: Payer: Self-pay | Admitting: Gastroenterology

## 2022-07-25 LAB — SURGICAL PATHOLOGY

## 2022-07-27 ENCOUNTER — Other Ambulatory Visit: Payer: Self-pay

## 2022-08-06 ENCOUNTER — Encounter: Payer: Self-pay | Admitting: Nurse Practitioner

## 2022-08-06 ENCOUNTER — Other Ambulatory Visit: Payer: Self-pay | Admitting: Nurse Practitioner

## 2022-08-06 NOTE — Progress Notes (Signed)
Stop glipizide and insulin. Keep log of blood sugar bring to next appointment

## 2022-09-14 ENCOUNTER — Other Ambulatory Visit: Payer: Self-pay | Admitting: Nurse Practitioner

## 2022-09-17 ENCOUNTER — Other Ambulatory Visit: Payer: Self-pay

## 2022-09-27 NOTE — Progress Notes (Signed)
BP 130/72   Pulse 92   Temp 97.8 F (36.6 C) (Oral)   Resp 16   Ht 5\' 9"  (1.753 m)   Wt 163 lb 1.6 oz (74 kg)   SpO2 97%   BMI 24.09 kg/m    Subjective:    Patient ID: Eric Carr, male    DOB: 08-12-1975, 47 y.o.   MRN: [id number]  HPI: Eric Carr is a 47 y.o. male  Chief Complaint  Patient presents with   Diabetes   Hyperlipidemia   Diabetes, Type 2:  -Last A1c 11.7, down to 6.7 -Medications: mounjaro, metformin 1000 mg BID -Patient is compliant with the above medications and reports no side effects.  -Checking BG at home: yes -Fasting home BG: 90-110 -Highest home BG since last visit: 155 -Lowest home BG since last visit: 90 -Diet: meat and vegetables -Exercise: none -Eye exam: due -Foot exam: utd -Microalbumin: utd -Statin: yes -PNA vaccine: no -Denies symptoms of hypoglycemia, polyuria, polydipsia, numbness extremities, foot ulcers/trauma.    HLD:  -Medications: atorvastatin -Patient is compliant with above medications and reports no side effects.  -Last lipid panel:  Lipid Panel     Component Value Date/Time   CHOL 174 06/28/2022 1447   CHOL 118 11/17/2018 0952   TRIG 436 (H) 06/28/2022 1447   HDL 36 (L) 06/28/2022 1447   HDL 44 11/17/2018 0952   CHOLHDL 4.8 06/28/2022 1447   LDLCALC  06/28/2022 1447     Comment:     . LDL cholesterol not calculated. Triglyceride levels greater than 400 mg/dL invalidate calculated LDL results. . Reference range: <100 . Desirable range <100 mg/dL for primary prevention;   <70 mg/dL for patients with CHD or diabetic patients  with > or = 2 CHD risk factors. Marland Kitchen LDL-C is now calculated using the Martin-Hopkins  calculation, which is a validated novel method providing  better accuracy than the Friedewald equation in the  estimation of LDL-C.  Horald Pollen et al. Lenox Ahr. 4098;119(14): 2061-2068  (http://education.[employer].com/faq/FAQ164)    LABVLDL 18 11/17/2018 0952   The 10-year ASCVD risk  score (Arnett DK, et al., 2019) is: 8.3%   Values used to calculate the score:     Age: 9 years     Sex: Male     Is Non-Hispanic African American: No     Diabetic: Yes     Tobacco smoker: No     Systolic Blood Pressure: 130 mmHg     Is BP treated: Yes     HDL Cholesterol: 36 mg/dL     Total Cholesterol: 174 mg/dL   Hypertension:  -Medications: lisinopril 5 mg daily -Patient is compliant with above medications and reports no side effects. -Checking BP at home (average): does not check -Denies any SOB, CP, vision changes, LE edema or symptoms of hypotension -Diet: recommend DASH diet  -Exercise: recommend 150 min of physical activity weekly      Relevant past medical, surgical, family and social history reviewed and updated as indicated. Interim medical history since our last visit reviewed. Allergies and medications reviewed and updated.  Review of Systems  Constitutional: Negative for fever or weight change.  Respiratory: Negative for cough and shortness of breath.   Cardiovascular: Negative for chest pain or palpitations.  Gastrointestinal: Negative for abdominal pain, no bowel changes.  Musculoskeletal: Negative for gait problem or joint swelling.  Skin: Negative for rash.  Neurological: Negative for dizziness or headache.  No other specific complaints in a complete review of systems (except as  listed in HPI above).      Objective:    BP 130/72   Pulse 92   Temp 97.8 F (36.6 C) (Oral)   Resp 16   Ht 5\' 9"  (1.753 m)   Wt 163 lb 1.6 oz (74 kg)   SpO2 97%   BMI 24.09 kg/m   Wt Readings from Last 3 Encounters:  09/28/22 163 lb 1.6 oz (74 kg)  07/18/22 182 lb 9.6 oz (82.8 kg)  07/06/22 193 lb 3.2 oz (87.6 kg)    Physical Exam  Constitutional: Patient appears well-developed and well-nourished.  No distress.  HEENT: head atraumatic, normocephalic, pupils equal and reactive to light, neck supple Cardiovascular: Normal rate, regular rhythm and normal heart sounds.   No murmur heard. No BLE edema. Pulmonary/Chest: Effort normal and breath sounds normal. No respiratory distress. Abdominal: Soft.  There is no tenderness. Psychiatric: Patient has a normal mood and affect. behavior is normal. Judgment and thought content normal.      Assessment & Plan:   Problem List Items Addressed This Visit       Cardiovascular and Mediastinum   Hypertension    Stable continue current treatment        Endocrine   Diabetes (HCC) - Primary    A1C has much improved, continue mounjaro 5 mg weekly,  and metformin 1000 mg two times a day      Relevant Medications   tirzepatide (MOUNJARO) 5 MG/0.5ML Pen   glucose blood test strip   Other Relevant Orders   POCT HgB A1C (Completed)     Other   Other hyperlipidemia    Continue atorvastatin 10 mg daily         Follow up plan: Return in about 3 months (around 12/29/2022) for follow up.

## 2022-09-28 ENCOUNTER — Ambulatory Visit (INDEPENDENT_AMBULATORY_CARE_PROVIDER_SITE_OTHER): Payer: 59 | Admitting: Nurse Practitioner

## 2022-09-28 ENCOUNTER — Encounter: Payer: Self-pay | Admitting: Nurse Practitioner

## 2022-09-28 ENCOUNTER — Other Ambulatory Visit: Payer: Self-pay

## 2022-09-28 VITALS — BP 130/72 | HR 92 | Temp 97.8°F | Resp 16 | Ht 69.0 in | Wt 163.1 lb

## 2022-09-28 DIAGNOSIS — Z7984 Long term (current) use of oral hypoglycemic drugs: Secondary | ICD-10-CM | POA: Diagnosis not present

## 2022-09-28 DIAGNOSIS — Z7985 Long-term (current) use of injectable non-insulin antidiabetic drugs: Secondary | ICD-10-CM | POA: Diagnosis not present

## 2022-09-28 DIAGNOSIS — I10 Essential (primary) hypertension: Secondary | ICD-10-CM | POA: Diagnosis not present

## 2022-09-28 DIAGNOSIS — E1165 Type 2 diabetes mellitus with hyperglycemia: Secondary | ICD-10-CM | POA: Diagnosis not present

## 2022-09-28 DIAGNOSIS — E7849 Other hyperlipidemia: Secondary | ICD-10-CM

## 2022-09-28 LAB — POCT GLYCOSYLATED HEMOGLOBIN (HGB A1C): Hemoglobin A1C: 6.7 % — AB (ref 4.0–5.6)

## 2022-09-28 MED ORDER — TIRZEPATIDE 5 MG/0.5ML ~~LOC~~ SOAJ
5.0000 mg | SUBCUTANEOUS | 1 refills | Status: DC
Start: 2022-09-28 — End: 2022-12-31
  Filled 2022-09-28: qty 6, 84d supply, fill #0
  Filled 2022-10-09 – 2022-10-10 (×2): qty 2, 28d supply, fill #0
  Filled 2022-11-08: qty 2, 28d supply, fill #1
  Filled 2022-12-10: qty 2, 28d supply, fill #2

## 2022-09-28 MED ORDER — GLUCOSE BLOOD VI STRP
1.0000 | ORAL_STRIP | 11 refills | Status: DC
Start: 2022-09-28 — End: 2023-04-30
  Filled 2022-09-28: qty 100, 30d supply, fill #0
  Filled 2022-12-10: qty 100, 30d supply, fill #1
  Filled 2023-03-14: qty 100, 30d supply, fill #2

## 2022-09-28 NOTE — Assessment & Plan Note (Signed)
Continue atorvastatin 10 mg daily. 

## 2022-09-28 NOTE — Assessment & Plan Note (Signed)
A1C has much improved, continue mounjaro 5 mg weekly,  and metformin 1000 mg two times a day

## 2022-09-28 NOTE — Assessment & Plan Note (Signed)
Stable continue current treatment

## 2022-10-08 NOTE — Progress Notes (Unsigned)
Name: Eric Carr   MRN: [id number]    DOB: 20-Feb-1976   Date:10/10/2022       Progress Note  Subjective  Chief Complaint  Chief Complaint  Patient presents with   Annual Exam    HPI  Patient presents for annual CPE .  IPSS Questionnaire (AUA-7): Over the past month.   1)  How often have you had a sensation of not emptying your bladder completely after you finish urinating?  0 - Not at all  2)  How often have you had to urinate again less than two hours after you finished urinating? 0 - Not at all  3)  How often have you found you stopped and started again several times when you urinated?  0 - Not at all  4) How difficult have you found it to postpone urination?  0 - Not at all  5) How often have you had a weak urinary stream?  0 - Not at all  6) How often have you had to push or strain to begin urination?  0 - Not at all  7) How many times did you most typically get up to urinate from the time you went to bed until the time you got up in the morning?  0 - None  Total score:  0-7 mildly symptomatic   8-19 moderately symptomatic   20-35 severely symptomatic     Diet: regular, well balanced diet Exercise: None, recommend 150 min of physical activity weekly   Last Dental Exam: March 2024 Last Eye Exam: February 2024  Depression: phq 9 is negative    10/10/2022    7:37 AM 09/28/2022    7:40 AM 07/06/2022    2:38 PM 06/28/2022    2:08 PM 01/07/2019    9:11 AM  Depression screen PHQ 2/9  Decreased Interest 0 0 0 0 0  Down, Depressed, Hopeless 0 0 0 0 0  PHQ - 2 Score 0 0 0 0 0    Hypertension:  BP Readings from Last 3 Encounters:  10/10/22 120/76  09/28/22 130/72  07/18/22 100/60    Obesity: Wt Readings from Last 3 Encounters:  10/10/22 163 lb 3.2 oz (74 kg)  09/28/22 163 lb 1.6 oz (74 kg)  07/18/22 182 lb 9.6 oz (82.8 kg)   BMI Readings from Last 3 Encounters:  10/10/22 24.10 kg/m  09/28/22 24.09 kg/m  07/18/22 26.97 kg/m     Lipids:  Lab Results   Component Value Date   CHOL 174 06/28/2022   CHOL 118 11/17/2018   Lab Results  Component Value Date   HDL 36 (L) 06/28/2022   HDL 44 11/17/2018   Lab Results  Component Value Date   LDLCALC  06/28/2022     Comment:     . LDL cholesterol not calculated. Triglyceride levels greater than 400 mg/dL invalidate calculated LDL results. . Reference range: <100 . Desirable range <100 mg/dL for primary prevention;   <70 mg/dL for patients with CHD or diabetic patients  with > or = 2 CHD risk factors. Marland Kitchen LDL-C is now calculated using the Martin-Hopkins  calculation, which is a validated novel method providing  better accuracy than the Friedewald equation in the  estimation of LDL-C.  Horald Pollen et al. Lenox Ahr. 3664;403(47): 2061-2068  (http://education.[facility].com/faq/FAQ164)    LDLCALC 56 11/17/2018   Lab Results  Component Value Date   TRIG 436 (H) 06/28/2022   TRIG 94 11/17/2018   Lab Results  Component Value Date   CHOLHDL 4.8  06/28/2022   CHOLHDL 2.7 11/17/2018   No results found for: "LDLDIRECT" Glucose:  Glucose  Date Value Ref Range Status  04/02/2019 185 (H) 65 - 99 mg/dL Final  16/11/9602 540 (H) 65 - 99 mg/dL Final   Glucose, Bld  Date Value Ref Range Status  06/28/2022 514 (HH) 65 - 99 mg/dL Final    Comment:    Verified by repeat analysis. Marland Kitchen .            Fasting reference interval . For someone without known diabetes, a glucose value >125 mg/dL indicates that they may have diabetes and this should be confirmed with a follow-up test. .   02/12/2020 267 (H) 70 - 99 mg/dL Final    Comment:    Glucose reference range applies only to samples taken after fasting for at least 8 hours.   Glucose-Capillary  Date Value Ref Range Status  07/18/2022 128 (H) 70 - 99 mg/dL Final    Comment:    Glucose reference range applies only to samples taken after fasting for at least 8 hours.    Flowsheet Row Office Visit from 10/10/2022 in [facility]  AUDIT-C Score 0       Married STD testing and prevention (HIV/chl/gon/syphilis):  no, completed Sexual history: sexually active Hep C Screening: completed Skin cancer: Discussed monitoring for atypical lesions Colorectal cancer: 07/18/2022 Prostate cancer:  no No results found for: "PSA"   Lung cancer:  Low Dose CT Chest recommended if Age 9-80 years, 30 pack-year currently smoking OR have quit w/in 15years. Patient  not applicable a candidate for screening   AAA: The USPSTF recommends one-time screening with ultrasonography in men ages 101 to 75 years who have ever smoked. Patient   no, a candidate for screening  ECG:  02/15/2020  Vaccines:  HPV: up to at age 51 , ask insurance if age between 4-45  Shingrix: 24-64 yo and ask insurance if covered when patient above 10 yo Pneumonia:  educated and discussed with patient. Flu:  educated and discussed with patient.    Advanced Care Planning: A voluntary discussion about advance care planning including the explanation and discussion of advance directives.  Discussed health care proxy and Living will, and the patient was able to identify a health care proxy as wife.  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   Hypertension 01/12/2021   Impingement syndrome of shoulder region 07/31/2019   Diabetes (HCC) 11/26/2018   Overweight (BMI 25.0-29.9) 11/26/2018   Hypogonadism in male 11/26/2018   Other hyperlipidemia 11/26/2018    Past Surgical History:  Procedure Laterality Date   APPENDECTOMY     CHOLECYSTECTOMY     COLONOSCOPY WITH PROPOFOL N/A 07/18/2022   Procedure: COLONOSCOPY WITH PROPOFOL;  Surgeon: Wyline Mood, MD;  Location: [facility]  ENDOSCOPY;  Service: Gastroenterology;  Laterality: N/A;   KNEE SURGERY Left    TONSILECTOMY, ADENOIDECTOMY, BILATERAL MYRINGOTOMY AND TUBES Bilateral    TONSILLECTOMY      No family history on file.  Social History   Socioeconomic History   Marital status: Married    Spouse name: Not on file   Number of children: 1   Years of education: Not on file   Highest  education level: Not on file  Occupational History   Not on file  Tobacco Use   Smoking status: Never   Smokeless tobacco: Never  Vaping Use   Vaping status: Never Used  Substance and Sexual Activity   Alcohol use: Not Currently    Comment: 1-2 drinks per year on average   Drug use: Not Currently   Sexual activity: Yes  Other Topics Concern   Not on file  Social History Narrative   Works at [employer] Strain: Low Risk  (10/10/2022)   Overall Financial Resource Strain (CARDIA)    Difficulty of Paying Living Expenses: Not hard at all  Food Insecurity: No Food Insecurity (10/10/2022)   Hunger Vital Sign    Worried About Running Out of Food in the Last Year: Never true    Ran Out of Food in the Last Year: Never true  Transportation Needs: No Transportation Needs (10/10/2022)   PRAPARE - Administrator, Civil Service (Medical): No    Lack of Transportation (Non-Medical): No  Physical Activity: Inactive (10/10/2022)   Exercise Vital Sign    Days of Exercise per Week: 0 days    Minutes of Exercise per Session: 0 min  Stress: No Stress Concern Present (10/10/2022)   [employer] of Occupational Health - Occupational Stress Questionnaire    Feeling of Stress : Only a little  Social Connections: Moderately Isolated (10/10/2022)   Social Connection and Isolation Panel [NHANES]    Frequency of Communication with Friends and Family: More than three times a week    Frequency of Social Gatherings with Friends and Family: More than three times a week    Attends  Religious Services: Never    Database administrator or Organizations: No    Attends Banker Meetings: Never    Marital Status: Married  Catering manager Violence: Not At Risk (10/10/2022)   Humiliation, Afraid, Rape, and Kick questionnaire    Fear of Current or Ex-Partner: No    Emotionally Abused: No    Physically Abused: No    Sexually Abused: No     Current Outpatient Medications:    atorvastatin (LIPITOR) 10 MG tablet, Take 1 tablet (10 mg total) by mouth daily., Disp: 90 tablet, Rfl: 1   lisinopril (ZESTRIL) 5 MG tablet, Take 1 tablet (5 mg total) by mouth at bedtime., Disp: 90 tablet, Rfl: 1   metFORMIN (GLUCOPHAGE) 1000 MG tablet, Take 1 tablet (1,000 mg total) by mouth 2 (two) times daily with a meal., Disp: 180 tablet, Rfl: 1   tirzepatide (MOUNJARO) 5 MG/0.5ML Pen, Inject 5 mg into the skin once a week., Disp: 6 mL, Rfl: 1   Continuous Glucose Sensor (FREESTYLE LIBRE 3 SENSOR) MISC, 1 each by Does not apply route every 14 (fourteen) days. Place 1 sensor on the skin every 14 days. Use to check glucose continuously (Patient not taking: Reported on 09/28/2022), Disp: 2 each, Rfl: 5   glucose blood test strip, Use daily as directed. (Patient not taking: Reported on 10/10/2022), Disp: 100 each, Rfl: 11   Insulin Pen Needle 31G X 5 MM MISC, 1 each by Does not apply route daily. (Patient not taking: Reported on 09/28/2022), Disp: 100 each, Rfl: 0  Allergies  Allergen Reactions   Zithromax [Azithromycin] Dermatitis     ROS  Constitutional: Negative for fever or weight change.  Respiratory: Negative for cough and shortness of breath.   Cardiovascular: Negative for  chest pain or palpitations.  Gastrointestinal: Negative for abdominal pain, no bowel changes.  Musculoskeletal: Negative for gait problem or joint swelling.  Skin: Negative for rash.  Neurological: Negative for dizziness or headache.  No other specific complaints in a complete review of systems (except as listed  in HPI above).    Objective  Vitals:   10/10/22 0737  BP: 120/76  Pulse: 84  Resp: 16  Temp: 98 F (36.7 C)  TempSrc: Oral  SpO2: 98%  Weight: 163 lb 3.2 oz (74 kg)  Height: 5\' 9"  (1.753 m)    Body mass index is 24.1 kg/m.  Physical Exam Constitutional: Patient appears well-developed and well-nourished. No distress.  HENT: Head: Normocephalic and atraumatic. Ears: B TMs ok, no erythema or effusion; Nose: Nose normal. Mouth/Throat: Oropharynx is clear and moist. No oropharyngeal exudate.  Eyes: Conjunctivae and EOM are normal. Pupils are equal, round, and reactive to light. No scleral icterus.  Neck: Normal range of motion. Neck supple. No JVD present. No thyromegaly present.  Cardiovascular: Normal rate, regular rhythm and normal heart sounds.  No murmur heard. No BLE edema. Pulmonary/Chest: Effort normal and breath sounds normal. No respiratory distress. Abdominal: Soft. Bowel sounds are normal, no distension. There is no tenderness. no masses MALE GENITALIA: Normal descended testes bilaterally, no masses palpated, no hernias, no lesions, no discharge RECTAL: Prostate normal size and consistency, no rectal masses or hemorrhoids Musculoskeletal: Normal range of motion, no joint effusions. No gross deformities Neurological: he is alert and oriented to person, place, and time. No cranial nerve deficit. Coordination, balance, strength, speech and gait are normal.  Skin: Skin is warm and dry. No rash noted. No erythema.  Psychiatric: Patient has a normal mood and affect. behavior is normal. Judgment and thought content normal.   Recent Results (from the past 2160 hour(s))  Glucose, capillary     Status: Abnormal   Collection Time: 07/18/22 12:38 PM  Result Value Ref Range   Glucose-Capillary 128 (H) 70 - 99 mg/dL    Comment: Glucose reference range applies only to samples taken after fasting for at least 8 hours.  Surgical pathology     Status: None   Collection Time: 07/18/22   1:17 PM  Result Value Ref Range   SURGICAL PATHOLOGY      SURGICAL PATHOLOGY CASE: [id number] PATIENT: Renaye Rakers Surgical Pathology Report     Specimen Submitted: A. Colon polyp, cecum; cold snare B. Colon polyp x3, ascending; cold snare C. Colon polyp, transverse; cold snare D. Colon polyp, descending; cold snare  Clinical History: Screening colonoscopy.  Colon polyps      DIAGNOSIS: A.  COLON POLYP, CECUM; COLD SNARE: - TUBULAR ADENOMA. - NEGATIVE FOR HIGH-GRADE DYSPLASIA AND MALIGNANCY.  B.  COLON POLYP X 3, ASCENDING; COLD SNARE: - TUBULAR ADENOMA (MULTIPLE FRAGMENTS). - NEGATIVE FOR HIGH-GRADE DYSPLASIA AND MALIGNANCY.  C.  COLON POLYP, TRANSVERSE; COLD SNARE: - TUBULAR ADENOMA. - NEGATIVE FOR HIGH-GRADE DYSPLASIA AND MALIGNANCY.  D.  COLON POLYP, DESCENDING; COLD SNARE: - TUBULAR ADENOMA. - NEGATIVE FOR HIGH-GRADE DYSPLASIA AND MALIGNANCY.  GROSS DESCRIPTION: A. Labeled: Cold snare cecal polyp Received: Formalin Collection time: 1:17 PM on 07/18/2022 Placed into formalin time: 1:17 PM on  07/18/2022 Tissue fragment(s): Multiple Size: Aggregate, 1.2 x 0.7 x 0.1 cm Description: Tan to pink soft tissue fragments Entirely submitted in 1 cassette.  B. Labeled: Cold snare ascending colon polyp x 3 Received: Formalin Collection time: 1:19 PM on 07/18/2022 Placed into formalin time: 1:19 PM on 07/18/2022  Tissue fragment(s): Multiple Size: Aggregate, 2.0 x 0.3 x 0.1 cm Description: Tan soft tissue fragments Entirely submitted in 1 cassette.  C. Labeled: Cold snare transverse colon polyp Received: Formalin Collection time: 1:25 PM on 07/18/2022 Placed into formalin time: 1:25 PM on 07/18/2022 Tissue fragment(s): 1 Size: 0.4 x 0.3 x 0.1 cm Description: Tan soft tissue fragment Entirely submitted in 1 cassette.  D. Labeled: Cold snare descending colon polyp Received: Formalin Collection time: 1:27 PM on 07/18/2022 Placed into formalin time: 1:27 PM  on 07/18/2022 Tissue fragment(s): Multiple Size: Aggregate, 0.7 x 0.3 x 0.1 cm Description: Tan soft tissue frag ments Entirely submitted in 1 cassette.  CM 07/18/2022  Final Diagnosis performed by Elijah Birk, MD.   Electronically signed 07/25/2022 8:52:07AM The electronic signature indicates that the named Attending Pathologist has evaluated the specimen Technical component performed at [facility], [address], [place], [place] [address] Lab: [phone] Dir: Jolene Schimke, MD, MMM  Professional component performed at [facility], [facility], [address] [place], [place], [place] [address] Lab: [phone] Dir: Beryle Quant, MD   POCT HgB A1C     Status: Abnormal   Collection Time: 09/28/22  7:44 AM  Result Value Ref Range   Hemoglobin A1C 6.7 (A) 4.0 - 5.6 %   HbA1c POC (<> result, manual entry)     HbA1c, POC (prediabetic range)     HbA1c, POC (controlled diabetic range)       Fall Risk:    10/10/2022    7:37 AM 09/28/2022    7:40 AM 07/06/2022    2:38 PM 06/28/2022    2:07 PM 01/07/2019    9:10 AM  Fall Risk   Falls in the past year? 0 0 0 0 0  Number falls in past yr: 0 0 0 0   Injury with Fall? 0 0 0 0      Functional Status Survey: Is the patient deaf or have difficulty hearing?: No Does the patient have difficulty seeing, even when wearing glasses/contacts?: No Does the patient have difficulty concentrating, remembering, or making decisions?: No Does the patient have difficulty walking or climbing stairs?: No Does the patient have difficulty dressing or bathing?: No Does the patient have difficulty doing errands alone such as visiting a doctor's office or shopping?: No    Assessment & Plan  1. Annual physical exam Recently had lab work Increase physical activity Eat well balanced diet Due for eye exam, call and schedule    -Prostate cancer screening and PSA options (with potential risks and benefits of testing vs not testing) were discussed  along with recent recs/guidelines. -USPSTF grade A and B recommendations reviewed with patient; age-appropriate recommendations, preventive care, screening tests, etc discussed and encouraged; healthy living encouraged; see AVS for patient education given to patient -Discussed importance of 150 minutes of physical activity weekly, eat two servings of fish weekly, eat one serving of tree nuts ( cashews, pistachios, pecans, almonds.Marland Kitchen) every other day, eat 6 servings of fruit/vegetables daily and drink plenty of water and avoid sweet beverages.  -Reviewed Health Maintenance: yes

## 2022-10-09 ENCOUNTER — Other Ambulatory Visit: Payer: Self-pay

## 2022-10-10 ENCOUNTER — Other Ambulatory Visit: Payer: Self-pay

## 2022-10-10 ENCOUNTER — Encounter: Payer: Self-pay | Admitting: Nurse Practitioner

## 2022-10-10 ENCOUNTER — Ambulatory Visit (INDEPENDENT_AMBULATORY_CARE_PROVIDER_SITE_OTHER): Payer: 59 | Admitting: Nurse Practitioner

## 2022-10-10 VITALS — BP 120/76 | HR 84 | Temp 98.0°F | Resp 16 | Ht 69.0 in | Wt 163.2 lb

## 2022-10-10 DIAGNOSIS — Z0000 Encounter for general adult medical examination without abnormal findings: Secondary | ICD-10-CM

## 2022-11-01 ENCOUNTER — Encounter: Payer: Self-pay | Admitting: Nurse Practitioner

## 2022-12-10 ENCOUNTER — Other Ambulatory Visit: Payer: Self-pay

## 2022-12-11 ENCOUNTER — Other Ambulatory Visit: Payer: Self-pay

## 2022-12-31 ENCOUNTER — Encounter: Payer: Self-pay | Admitting: Nurse Practitioner

## 2022-12-31 ENCOUNTER — Other Ambulatory Visit: Payer: Self-pay

## 2022-12-31 ENCOUNTER — Ambulatory Visit (INDEPENDENT_AMBULATORY_CARE_PROVIDER_SITE_OTHER): Payer: 59 | Admitting: Nurse Practitioner

## 2022-12-31 VITALS — BP 118/76 | HR 98 | Temp 98.9°F | Resp 16 | Ht 69.0 in | Wt 156.3 lb

## 2022-12-31 DIAGNOSIS — Z125 Encounter for screening for malignant neoplasm of prostate: Secondary | ICD-10-CM | POA: Diagnosis not present

## 2022-12-31 DIAGNOSIS — Z7984 Long term (current) use of oral hypoglycemic drugs: Secondary | ICD-10-CM | POA: Diagnosis not present

## 2022-12-31 DIAGNOSIS — I10 Essential (primary) hypertension: Secondary | ICD-10-CM | POA: Diagnosis not present

## 2022-12-31 DIAGNOSIS — E7849 Other hyperlipidemia: Secondary | ICD-10-CM

## 2022-12-31 DIAGNOSIS — E1165 Type 2 diabetes mellitus with hyperglycemia: Secondary | ICD-10-CM | POA: Diagnosis not present

## 2022-12-31 MED ORDER — LISINOPRIL 5 MG PO TABS
5.0000 mg | ORAL_TABLET | Freq: Every day | ORAL | 1 refills | Status: DC
Start: 2022-12-31 — End: 2023-04-30
  Filled 2022-12-31: qty 90, 90d supply, fill #0
  Filled 2023-04-01: qty 90, 90d supply, fill #1

## 2022-12-31 MED ORDER — ATORVASTATIN CALCIUM 10 MG PO TABS
10.0000 mg | ORAL_TABLET | Freq: Every day | ORAL | 1 refills | Status: DC
Start: 2022-12-31 — End: 2023-08-20
  Filled 2022-12-31: qty 90, 90d supply, fill #0
  Filled 2023-04-01: qty 90, 90d supply, fill #1

## 2022-12-31 MED ORDER — TIRZEPATIDE 5 MG/0.5ML ~~LOC~~ SOAJ
5.0000 mg | SUBCUTANEOUS | 1 refills | Status: DC
Start: 2022-12-31 — End: 2023-04-30
  Filled 2022-12-31: qty 6, 84d supply, fill #0
  Filled 2023-01-02: qty 2, 28d supply, fill #0
  Filled 2023-02-04: qty 2, 28d supply, fill #1
  Filled 2023-02-25: qty 2, 28d supply, fill #2
  Filled 2023-04-01: qty 2, 28d supply, fill #3

## 2022-12-31 NOTE — Assessment & Plan Note (Signed)
Getting A1c today.  Patient states his blood sugars been running between 90s and 100s in the morning.  He would like to discuss maybe tapering off his medications.  Will get A1c today.  If in normal range can start weaning off metformin. Taking metformin 1000 mg twice daily 5 mg weekly.

## 2022-12-31 NOTE — Assessment & Plan Note (Signed)
Blood pressure at goal. Continue taking lisinopril 5 mg daily

## 2022-12-31 NOTE — Assessment & Plan Note (Signed)
Continue atorvastatin 10 mg daily. 

## 2022-12-31 NOTE — Progress Notes (Signed)
BP 118/76   Pulse 98   Temp 98.9 F (37.2 C) (Oral)   Resp 16   Ht 5\' 9"  (1.753 m)   Wt 156 lb 4.8 oz (70.9 kg)   SpO2 99%   BMI 23.08 kg/m    Subjective:    Patient ID: Eric Carr, male    DOB: 18-Jul-1975, 47 y.o.   MRN: [id number]  HPI: Eric Carr is a 47 y.o. male  Chief Complaint  Patient presents with   Medical Management of Chronic Issues   Diabetes, Type 2:  -Last A1c 6.7 -Medications: mounjaro, metformin 1000 mg BID -Patient is compliant with the above medications and reports no side effects.  -Checking BG at home: yes -Fasting home BG: 90-100s -Diet: reduce sugar and processed foods in your diet  -Exercise: recommend 150 min of physical activity weekly   -Eye exam: utd, requested records -Foot exam: utd -Microalbumin: utd -Statin: yes -PNA vaccine: no -Denies symptoms of hypoglycemia, polyuria, polydipsia, numbness extremities, foot ulcers/trauma.    HLD:  -Medications: atorvastatin 10 mg daily -Patient is compliant with above medications and reports no side effects.  -Last lipid panel:  Lipid Panel     Component Value Date/Time   CHOL 174 06/28/2022 1447   CHOL 118 11/17/2018 0952   TRIG 436 (H) 06/28/2022 1447   HDL 36 (L) 06/28/2022 1447   HDL 44 11/17/2018 0952   CHOLHDL 4.8 06/28/2022 1447   LDLCALC  06/28/2022 1447     Comment:     . LDL cholesterol not calculated. Triglyceride levels greater than 400 mg/dL invalidate calculated LDL results. . Reference range: <100 . Desirable range <100 mg/dL for primary prevention;   <70 mg/dL for patients with CHD or diabetic patients  with > or = 2 CHD risk factors. Marland Kitchen LDL-C is now calculated using the Martin-Hopkins  calculation, which is a validated novel method providing  better accuracy than the Friedewald equation in the  estimation of LDL-C.  Horald Pollen et al. Lenox Ahr. 5284;132(44): 2061-2068  (http://education.[facility].com/faq/FAQ164)    LABVLDL 18 11/17/2018 0952   The  10-year ASCVD risk score (Arnett DK, et al., 2019) is: 7%   Values used to calculate the score:     Age: 77 years     Sex: Male     Is Non-Hispanic African American: No     Diabetic: Yes     Tobacco smoker: No     Systolic Blood Pressure: 118 mmHg     Is BP treated: Yes     HDL Cholesterol: 36 mg/dL     Total Cholesterol: 174 mg/dL   Hypertension:  -Medications: lisinopril 5 mg daily -Patient is compliant with above medications and reports no side effects. -Checking BP at home (average): does not check blood pressure -Denies any SOB, CP, vision changes, LE edema or symptoms of hypotension -Diet: recommend DASH diet  -Exercise: recommend 150 min of physical activity weekly       12/31/2022    7:15 AM 10/10/2022    7:37 AM 09/28/2022    7:27 AM  Vitals with BMI  Height 5\' 9"  5\' 9"  5\' 9"   Weight 156 lbs 5 oz 163 lbs 3 oz 163 lbs 2 oz  BMI 23.07 24.09 24.07  Systolic 118 120 010  Diastolic 76 76 72  Pulse 98 84 92    Relevant past medical, surgical, family and social history reviewed and updated as indicated. Interim medical history since our last visit reviewed. Allergies and medications reviewed and updated.  Review of Systems  Constitutional: Negative for fever or weight change.  Respiratory: Negative for cough and shortness of breath.   Cardiovascular: Negative for chest pain or palpitations.  Gastrointestinal: Negative for abdominal pain, no bowel changes.  Musculoskeletal: Negative for gait problem or joint swelling.  Skin: Negative for rash.  Neurological: Negative for dizziness or headache.  No other specific complaints in a complete review of systems (except as listed in HPI above).      Objective:    BP 118/76   Pulse 98   Temp 98.9 F (37.2 C) (Oral)   Resp 16   Ht 5\' 9"  (1.753 m)   Wt 156 lb 4.8 oz (70.9 kg)   SpO2 99%   BMI 23.08 kg/m   Wt Readings from Last 3 Encounters:  12/31/22 156 lb 4.8 oz (70.9 kg)  10/10/22 163 lb 3.2 oz (74 kg)  09/28/22  163 lb 1.6 oz (74 kg)    Physical Exam  Constitutional: Patient appears well-developed and well-nourished.  No distress.  HEENT: head atraumatic, normocephalic, pupils equal and reactive to light, neck supple Cardiovascular: Normal rate, regular rhythm and normal heart sounds.  No murmur heard. No BLE edema. Pulmonary/Chest: Effort normal and breath sounds normal. No respiratory distress. Abdominal: Soft.  There is no tenderness. Psychiatric: Patient has a normal mood and affect. behavior is normal. Judgment and thought content normal.      Assessment & Plan:   Problem List Items Addressed This Visit       Cardiovascular and Mediastinum   Hypertension    Blood pressure at goal. Continue taking lisinopril 5 mg daily      Relevant Medications   atorvastatin (LIPITOR) 10 MG tablet   lisinopril (ZESTRIL) 5 MG tablet   Other Relevant Orders   CBC with Differential/Platelet   COMPLETE METABOLIC PANEL WITH GFR     Endocrine   Diabetes (HCC) - Primary    Getting A1c today.  Patient states his blood sugars been running between 90s and 100s in the morning.  He would like to discuss maybe tapering off his medications.  Will get A1c today.  If in normal range can start weaning off metformin. Taking metformin 1000 mg twice daily 5 mg weekly.      Relevant Medications   atorvastatin (LIPITOR) 10 MG tablet   lisinopril (ZESTRIL) 5 MG tablet   tirzepatide (MOUNJARO) 5 MG/0.5ML Pen   Other Relevant Orders   COMPLETE METABOLIC PANEL WITH GFR   Hemoglobin A1c     Other   Other hyperlipidemia    Continue atorvastatin 10 mg daily.      Relevant Medications   atorvastatin (LIPITOR) 10 MG tablet   lisinopril (ZESTRIL) 5 MG tablet   Other Relevant Orders   Lipid panel   Other Visit Diagnoses     Screening for prostate cancer       Relevant Orders   PSA          Follow up plan: Return in about 4 months (around 04/30/2023) for follow up.

## 2023-01-01 ENCOUNTER — Encounter: Payer: Self-pay | Admitting: Nurse Practitioner

## 2023-01-01 LAB — HEMOGLOBIN A1C
Hgb A1c MFr Bld: 6.1 %{Hb} — ABNORMAL HIGH (ref <5.7)
Mean Plasma Glucose: 128 mg/dL
eAG (mmol/L): 7.1 mmol/L

## 2023-01-01 LAB — COMPLETE METABOLIC PANEL WITH GFR
AG Ratio: 1.4 (calc) (ref 1.0–2.5)
ALT: 10 U/L (ref 9–46)
AST: 13 U/L (ref 10–40)
Albumin: 4.2 g/dL (ref 3.6–5.1)
Alkaline phosphatase (APISO): 55 U/L (ref 36–130)
BUN: 19 mg/dL (ref 7–25)
CO2: 27 mmol/L (ref 20–32)
Calcium: 10.1 mg/dL (ref 8.6–10.3)
Chloride: 100 mmol/L (ref 98–110)
Creat: 0.91 mg/dL (ref 0.60–1.29)
Globulin: 2.9 g/dL (ref 1.9–3.7)
Glucose, Bld: 101 mg/dL — ABNORMAL HIGH (ref 65–99)
Potassium: 5.5 mmol/L — ABNORMAL HIGH (ref 3.5–5.3)
Sodium: 136 mmol/L (ref 135–146)
Total Bilirubin: 0.5 mg/dL (ref 0.2–1.2)
Total Protein: 7.1 g/dL (ref 6.1–8.1)
eGFR: 103 mL/min/{1.73_m2} (ref >=60)

## 2023-01-01 LAB — CBC WITH DIFFERENTIAL/PLATELET
Absolute Lymphocytes: 2132 {cells}/uL (ref 850–3900)
Absolute Monocytes: 1103 {cells}/uL — ABNORMAL HIGH (ref 200–950)
Basophils Absolute: 88 {cells}/uL (ref 0–200)
Basophils Relative: 0.6 %
Eosinophils Absolute: 74 {cells}/uL (ref 15–500)
Eosinophils Relative: 0.5 %
HCT: 42.9 % (ref 38.5–50.0)
Hemoglobin: 14.3 g/dL (ref 13.2–17.1)
MCH: 28.9 pg (ref 27.0–33.0)
MCHC: 33.3 g/dL (ref 32.0–36.0)
MCV: 86.7 fL (ref 80.0–100.0)
MPV: 9.3 fL (ref 7.5–12.5)
Monocytes Relative: 7.5 %
Neutro Abs: 11304 {cells}/uL — ABNORMAL HIGH (ref 1500–7800)
Neutrophils Relative %: 76.9 %
Platelets: 406 10*3/uL — ABNORMAL HIGH (ref 140–400)
RBC: 4.95 10*6/uL (ref 4.20–5.80)
RDW: 12.2 % (ref 11.0–15.0)
Total Lymphocyte: 14.5 %
WBC: 14.7 10*3/uL — ABNORMAL HIGH (ref 3.8–10.8)

## 2023-01-01 LAB — LIPID PANEL
Cholesterol: 80 mg/dL (ref <200)
HDL: 41 mg/dL (ref >=40)
LDL Cholesterol (Calc): 27 mg/dL
Non-HDL Cholesterol (Calc): 39 mg/dL (ref <130)
Total CHOL/HDL Ratio: 2 (calc) (ref <5.0)
Triglycerides: 50 mg/dL (ref <150)

## 2023-01-01 LAB — PSA: PSA: 0.73 ng/mL (ref <=4.00)

## 2023-01-01 NOTE — Progress Notes (Unsigned)
There were no vitals taken for this visit.   Subjective:    Patient ID: Eric Carr, male    DOB: [birth year], 47 y.o.   MRN: [id number]  HPI: Eric Carr is a 47 y.o. male  No chief complaint on file.  URI Compliant: symptoms started *** -Fever: ** -Cough: *** -Shortness of breath: ** -Wheezing: *** -Chest congestion: *** -Nasal congestion: *** -Runny nose: *** -Post nasal drip: *** -Sore throat: *** -Sinus pressure: *** -Headache: *** -Face pain: *** -Ear pain:  *** -Ear pressure: *** -Relief with OTC cold/cough medications: yes   Relevant past medical, surgical, family and social history reviewed and updated as indicated. Interim medical history since our last visit reviewed. Allergies and medications reviewed and updated.  Review of Systems  Ten systems reviewed and is negative except as mentioned in HPI ***      Objective:    There were no vitals taken for this visit.  Wt Readings from Last 3 Encounters:  12/31/22 156 lb 4.8 oz (70.9 kg)  10/10/22 163 lb 3.2 oz (74 kg)  09/28/22 163 lb 1.6 oz (74 kg)    Physical Exam  Constitutional: Patient appears well-developed and well-nourished. Obese *** No distress.  HEENT: head atraumatic, normocephalic, pupils equal and reactive to light, ears ***, neck supple, throat within normal limits Cardiovascular: Normal rate, regular rhythm and normal heart sounds.  No murmur heard. No BLE edema. Pulmonary/Chest: Effort normal and breath sounds normal. No respiratory distress. Abdominal: Soft.  There is no tenderness. Psychiatric: Patient has a normal mood and affect. behavior is normal. Judgment and thought content normal.  Results for orders placed or performed in visit on 12/31/22  CBC with Differential/Platelet  Result Value Ref Range   WBC 14.7 (H) 3.8 - 10.8 Thousand/uL   RBC 4.95 4.20 - 5.80 Million/uL   Hemoglobin 14.3 13.2 - 17.1 g/dL   HCT 47.8 29.5 - 62.1 %   MCV 86.7 80.0 - 100.0 fL   MCH 28.9 27.0  - 33.0 pg   MCHC 33.3 32.0 - 36.0 g/dL   RDW 30.8 65.7 - 84.6 %   Platelets 406 (H) 140 - 400 Thousand/uL   MPV 9.3 7.5 - 12.5 fL   Neutro Abs 11,304 (H) 1,500 - 7,800 cells/uL   Absolute Lymphocytes 2,132 850 - 3,900 cells/uL   Absolute Monocytes 1,103 (H) 200 - 950 cells/uL   Eosinophils Absolute 74 15 - 500 cells/uL   Basophils Absolute 88 0 - 200 cells/uL   Neutrophils Relative % 76.9 %   Total Lymphocyte 14.5 %   Monocytes Relative 7.5 %   Eosinophils Relative 0.5 %   Basophils Relative 0.6 %  COMPLETE METABOLIC PANEL WITH GFR  Result Value Ref Range   Glucose, Bld 101 (H) 65 - 99 mg/dL   BUN 19 7 - 25 mg/dL   Creat 9.62 9.52 - 8.41 mg/dL   eGFR 324 > OR = 60 MW/NUU/7.25D6   BUN/Creatinine Ratio SEE NOTE: 6 - 22 (calc)   Sodium 136 135 - 146 mmol/L   Potassium 5.5 (H) 3.5 - 5.3 mmol/L   Chloride 100 98 - 110 mmol/L   CO2 27 20 - 32 mmol/L   Calcium 10.1 8.6 - 10.3 mg/dL   Total Protein 7.1 6.1 - 8.1 g/dL   Albumin 4.2 3.6 - 5.1 g/dL   Globulin 2.9 1.9 - 3.7 g/dL (calc)   AG Ratio 1.4 1.0 - 2.5 (calc)   Total Bilirubin 0.5 0.2 - 1.2 mg/dL  Alkaline phosphatase (APISO) 55 36 - 130 U/L   AST 13 10 - 40 U/L   ALT 10 9 - 46 U/L  Lipid panel  Result Value Ref Range   Cholesterol 80 <200 mg/dL   HDL 41 > OR = 40 mg/dL   Triglycerides 50 <161 mg/dL   LDL Cholesterol (Calc) 27 mg/dL (calc)   Total CHOL/HDL Ratio 2.0 <5.0 (calc)   Non-HDL Cholesterol (Calc) 39 <096 mg/dL (calc)  PSA  Result Value Ref Range   PSA 0.73 < OR = 4.00 ng/mL  Hemoglobin A1c  Result Value Ref Range   Hgb A1c MFr Bld 6.1 (H) <5.7 % of total Hgb   Mean Plasma Glucose 128 mg/dL   eAG (mmol/L) 7.1 mmol/L      Assessment & Plan:   Problem List Items Addressed This Visit   None    Follow up plan: No follow-ups on file.

## 2023-01-02 ENCOUNTER — Ambulatory Visit (INDEPENDENT_AMBULATORY_CARE_PROVIDER_SITE_OTHER): Payer: 59 | Admitting: Nurse Practitioner

## 2023-01-02 ENCOUNTER — Encounter: Payer: Self-pay | Admitting: Nurse Practitioner

## 2023-01-02 ENCOUNTER — Other Ambulatory Visit: Payer: Self-pay

## 2023-01-02 VITALS — BP 122/78 | HR 98 | Temp 97.8°F | Resp 16 | Ht 69.0 in | Wt 155.7 lb

## 2023-01-02 DIAGNOSIS — D72829 Elevated white blood cell count, unspecified: Secondary | ICD-10-CM

## 2023-01-02 DIAGNOSIS — J0140 Acute pansinusitis, unspecified: Secondary | ICD-10-CM

## 2023-01-02 MED ORDER — AMOXICILLIN-POT CLAVULANATE 875-125 MG PO TABS
1.0000 | ORAL_TABLET | Freq: Two times a day (BID) | ORAL | 0 refills | Status: DC
Start: 2023-01-02 — End: 2023-04-30
  Filled 2023-01-02: qty 20, 10d supply, fill #0

## 2023-01-21 ENCOUNTER — Other Ambulatory Visit: Payer: Self-pay | Admitting: Nurse Practitioner

## 2023-01-21 DIAGNOSIS — E1165 Type 2 diabetes mellitus with hyperglycemia: Secondary | ICD-10-CM

## 2023-01-22 ENCOUNTER — Other Ambulatory Visit: Payer: Self-pay

## 2023-01-22 ENCOUNTER — Other Ambulatory Visit: Payer: Self-pay | Admitting: Nurse Practitioner

## 2023-01-22 DIAGNOSIS — E1165 Type 2 diabetes mellitus with hyperglycemia: Secondary | ICD-10-CM

## 2023-01-22 MED ORDER — METFORMIN HCL 1000 MG PO TABS
1000.0000 mg | ORAL_TABLET | Freq: Two times a day (BID) | ORAL | 1 refills | Status: DC
Start: 2023-01-22 — End: 2023-04-30
  Filled 2023-01-22: qty 180, 90d supply, fill #0
  Filled 2023-04-23: qty 180, 90d supply, fill #1

## 2023-01-22 NOTE — Telephone Encounter (Signed)
Medication Refill -  Most Recent Primary Care Visit:  Provider: Della Goo F  Department: [facility]  Visit Type: OFFICE VISIT  Date: 01/02/2023  Medication: metFORMIN (GLUCOPHAGE) 1000 MG tablet   Has the patient contacted their pharmacy? Yes  (Agent: If yes, when and what did the pharmacy advise?)  Is this the correct pharmacy for this prescription? Yes If no, delete pharmacy and type the correct one.  This is the patient's preferred pharmacy:    [facility] [address] [place] [address] Phone: [phone] Fax: [phone]   Has the prescription been filled recently? Yes  Is the patient out of the medication? No  Has the patient been seen for an appointment in the last year OR does the patient have an upcoming appointment? Yes  Can we respond through MyChart? Yes  Agent: Please be advised that Rx refills may take up to 3 business days. We ask that you follow-up with your pharmacy.

## 2023-01-22 NOTE — Telephone Encounter (Signed)
Requested Prescriptions  Pending Prescriptions Disp Refills   metFORMIN (GLUCOPHAGE) 1000 MG tablet 180 tablet 1    Sig: Take 1 tablet (1,000 mg total) by mouth 2 (two) times daily with a meal.     Endocrinology:  Diabetes - Biguanides Failed - 01/22/2023 12:24 PM      Failed - B12 Level in normal range and within 720 days    No results found for: "VITAMINB12"       Passed - Cr in normal range and within 360 days    Creat  Date Value Ref Range Status  12/31/2022 0.91 0.60 - 1.29 mg/dL Final   Creatinine, Urine  Date Value Ref Range Status  06/28/2022 30 20 - 320 mg/dL Final         Passed - HBA1C is between 0 and 7.9 and within 180 days    HbA1c POC (<> result, manual entry)  Date Value Ref Range Status  12/29/2018 7.6 4.0 - 5.6 % Final   Hgb A1c MFr Bld  Date Value Ref Range Status  12/31/2022 6.1 (H) <5.7 % of total Hgb Final    Comment:    For someone without known diabetes, a hemoglobin  A1c value between 5.7% and 6.4% is consistent with prediabetes and should be confirmed with a  follow-up test. . For someone with known diabetes, a value <7% indicates that their diabetes is well controlled. A1c targets should be individualized based on duration of diabetes, age, comorbid conditions, and other considerations. . This assay result is consistent with an increased risk of diabetes. . Currently, no consensus exists regarding use of hemoglobin A1c for diagnosis of diabetes for children. .          Passed - eGFR in normal range and within 360 days    GFR calc Af Amer  Date Value Ref Range Status  04/02/2019 120 >59 mL/min/1.73 Final   GFR, Estimated  Date Value Ref Range Status  02/12/2020 >60 >60 mL/min Final    Comment:    (NOTE) Calculated using the CKD-EPI Creatinine Equation (2021)    eGFR  Date Value Ref Range Status  12/31/2022 103 > OR = 60 mL/min/1.34m2 Final         Passed - Valid encounter within last 6 months    Recent Outpatient Visits            2 weeks ago Acute non-recurrent pansinusitis   [facility] Della Goo F, FNP   3 weeks ago Type 2 diabetes mellitus with hyperglycemia, without long-term current use of insulin [facility])   [facility] Berniece Salines, FNP   3 months ago Annual physical exam   [facility] Della Goo F, FNP   3 months ago Type 2 diabetes mellitus with hyperglycemia, without long-term current use of insulin [facility])   [facility] Della Goo F, FNP   6 months ago Type 2 diabetes mellitus with hyperglycemia, without long-term current use of insulin [facility])   [place] [facility] Berniece Salines, FNP       Future Appointments             In 3 months Zane Herald, Rudolpho Sevin, FNP [facility]            Passed - CBC within normal limits and completed in the last 12 months    WBC  Date Value Ref Range Status  12/31/2022 14.7 (  H) 3.8 - 10.8 Thousand/uL Final   RBC  Date Value Ref Range Status  12/31/2022 4.95 4.20 - 5.80 Million/uL Final   Hemoglobin  Date Value Ref Range Status  12/31/2022 14.3 13.2 - 17.1 g/dL Final  16/11/9602 54.0 13.0 - 17.7 g/dL Final   HCT  Date Value Ref Range Status  12/31/2022 42.9 38.5 - 50.0 % Final   Hematocrit  Date Value Ref Range Status  11/17/2018 48.9 37.5 - 51.0 % Final   MCHC  Date Value Ref Range Status  12/31/2022 33.3 32.0 - 36.0 g/dL Final    Comment:    For adults, a slight decrease in the calculated MCHC value (in the range of 30 to 32 g/dL) is most likely not clinically significant; however, it should be interpreted with caution in correlation with other red cell parameters and the patient's clinical condition.    [facility]  Date Value Ref Range Status  12/31/2022 28.9 27.0 - 33.0 pg Final   MCV  Date Value Ref Range Status  12/31/2022 86.7 80.0 - 100.0 fL Final  11/17/2018 85 79 - 97  fL Final   No results found for: "PLTCOUNTKUC", "LABPLAT", "POCPLA" RDW  Date Value Ref Range Status  12/31/2022 12.2 11.0 - 15.0 % Final  11/17/2018 13.3 11.6 - 15.4 % Final

## 2023-01-22 NOTE — Telephone Encounter (Signed)
Requested Prescriptions  Pending Prescriptions Disp Refills   metFORMIN (GLUCOPHAGE) 1000 MG tablet 180 tablet 1    Sig: Take 1 tablet (1,000 mg total) by mouth 2 (two) times daily with a meal.     Endocrinology:  Diabetes - Biguanides Failed - 01/21/2023  8:58 AM      Failed - B12 Level in normal range and within 720 days    No results found for: "VITAMINB12"       Passed - Cr in normal range and within 360 days    Creat  Date Value Ref Range Status  12/31/2022 0.91 0.60 - 1.29 mg/dL Final   Creatinine, Urine  Date Value Ref Range Status  06/28/2022 30 20 - 320 mg/dL Final         Passed - HBA1C is between 0 and 7.9 and within 180 days    HbA1c POC (<> result, manual entry)  Date Value Ref Range Status  12/29/2018 7.6 4.0 - 5.6 % Final   Hgb A1c MFr Bld  Date Value Ref Range Status  12/31/2022 6.1 (H) <5.7 % of total Hgb Final    Comment:    For someone without known diabetes, a hemoglobin  A1c value between 5.7% and 6.4% is consistent with prediabetes and should be confirmed with a  follow-up test. . For someone with known diabetes, a value <7% indicates that their diabetes is well controlled. A1c targets should be individualized based on duration of diabetes, age, comorbid conditions, and other considerations. . This assay result is consistent with an increased risk of diabetes. . Currently, no consensus exists regarding use of hemoglobin A1c for diagnosis of diabetes for children. .          Passed - eGFR in normal range and within 360 days    GFR calc Af Amer  Date Value Ref Range Status  04/02/2019 120 >59 mL/min/1.73 Final   GFR, Estimated  Date Value Ref Range Status  02/12/2020 >60 >60 mL/min Final    Comment:    (NOTE) Calculated using the CKD-EPI Creatinine Equation (2021)    eGFR  Date Value Ref Range Status  12/31/2022 103 > OR = 60 mL/min/1.46m2 Final         Passed - Valid encounter within last 6 months    Recent Outpatient Visits            2 weeks ago Acute non-recurrent pansinusitis   [facility] Della Goo F, FNP   3 weeks ago Type 2 diabetes mellitus with hyperglycemia, without long-term current use of insulin [facility])   [facility] Berniece Salines, FNP   3 months ago Annual physical exam   [facility] Della Goo F, FNP   3 months ago Type 2 diabetes mellitus with hyperglycemia, without long-term current use of insulin [facility])   [facility] Della Goo F, FNP   6 months ago Type 2 diabetes mellitus with hyperglycemia, without long-term current use of insulin [facility])   [place] Berniece Salines, FNP       Future Appointments             In 3 months Zane Herald, Rudolpho Sevin, FNP [facility]            Passed - CBC within normal limits and completed in the last 12 months    WBC  Date Value Ref Range Status  12/31/2022  14.7 (H) 3.8 - 10.8 Thousand/uL Final   RBC  Date Value Ref Range Status  12/31/2022 4.95 4.20 - 5.80 Million/uL Final   Hemoglobin  Date Value Ref Range Status  12/31/2022 14.3 13.2 - 17.1 g/dL Final  14/78/2956 21.3 13.0 - 17.7 g/dL Final   HCT  Date Value Ref Range Status  12/31/2022 42.9 38.5 - 50.0 % Final   Hematocrit  Date Value Ref Range Status  11/17/2018 48.9 37.5 - 51.0 % Final   MCHC  Date Value Ref Range Status  12/31/2022 33.3 32.0 - 36.0 g/dL Final    Comment:    For adults, a slight decrease in the calculated MCHC value (in the range of 30 to 32 g/dL) is most likely not clinically significant; however, it should be interpreted with caution in correlation with other red cell parameters and the patient's clinical condition.    [facility]  Date Value Ref Range Status  12/31/2022 28.9 27.0 - 33.0 pg Final   MCV  Date Value Ref Range Status  12/31/2022 86.7 80.0 - 100.0 fL Final  11/17/2018 85 79 - 97  fL Final   No results found for: "PLTCOUNTKUC", "LABPLAT", "POCPLA" RDW  Date Value Ref Range Status  12/31/2022 12.2 11.0 - 15.0 % Final  11/17/2018 13.3 11.6 - 15.4 % Final

## 2023-02-25 ENCOUNTER — Other Ambulatory Visit: Payer: Self-pay

## 2023-02-28 ENCOUNTER — Other Ambulatory Visit: Payer: Self-pay

## 2023-04-01 ENCOUNTER — Other Ambulatory Visit: Payer: Self-pay

## 2023-04-08 ENCOUNTER — Other Ambulatory Visit: Payer: Self-pay

## 2023-04-08 LAB — HM DIABETES EYE EXAM

## 2023-04-29 NOTE — Progress Notes (Unsigned)
 There were no vitals taken for this visit.   Subjective:    Patient ID: Eric Carr, male    DOB: [birth year], 48 y.o.   MRN: [id number]  HPI: Eric Carr is a 48 y.o. male  No chief complaint on file.   Discussed the use of AI scribe software for clinical note transcription with the patient, who gave verbal consent to proceed.  History of Present Illness           01/02/2023    8:01 AM 12/31/2022    7:16 AM 10/10/2022    7:37 AM  Depression screen PHQ 2/9  Decreased Interest 0 0 0  Down, Depressed, Hopeless 0 0 0  PHQ - 2 Score 0 0 0    Relevant past medical, surgical, family and social history reviewed and updated as indicated. Interim medical history since our last visit reviewed. Allergies and medications reviewed and updated.  Review of Systems  Per HPI unless specifically indicated above     Objective:    There were no vitals taken for this visit.  {Vitals History (Optional):23777} Wt Readings from Last 3 Encounters:  01/02/23 155 lb 11.2 oz (70.6 kg)  12/31/22 156 lb 4.8 oz (70.9 kg)  10/10/22 163 lb 3.2 oz (74 kg)    Physical Exam  Results for orders placed or performed in visit on 12/31/22  CBC with Differential/Platelet   Collection Time: 12/31/22  8:02 AM  Result Value Ref Range   WBC 14.7 (H) 3.8 - 10.8 Thousand/uL   RBC 4.95 4.20 - 5.80 Million/uL   Hemoglobin 14.3 13.2 - 17.1 g/dL   HCT 44.0 10.2 - 72.5 %   MCV 86.7 80.0 - 100.0 fL   MCH 28.9 27.0 - 33.0 pg   MCHC 33.3 32.0 - 36.0 g/dL   RDW 36.6 44.0 - 34.7 %   Platelets 406 (H) 140 - 400 Thousand/uL   MPV 9.3 7.5 - 12.5 fL   Neutro Abs 11,304 (H) 1,500 - 7,800 cells/uL   Absolute Lymphocytes 2,132 850 - 3,900 cells/uL   Absolute Monocytes 1,103 (H) 200 - 950 cells/uL   Eosinophils Absolute 74 15 - 500 cells/uL   Basophils Absolute 88 0 - 200 cells/uL   Neutrophils Relative % 76.9 %   Total Lymphocyte 14.5 %   Monocytes Relative 7.5 %   Eosinophils Relative 0.5 %   Basophils  Relative 0.6 %  COMPLETE METABOLIC PANEL WITH GFR   Collection Time: 12/31/22  8:02 AM  Result Value Ref Range   Glucose, Bld 101 (H) 65 - 99 mg/dL   BUN 19 7 - 25 mg/dL   Creat 4.25 9.56 - 3.87 mg/dL   eGFR 564 > OR = 60 PP/IRJ/1.88C1   BUN/Creatinine Ratio SEE NOTE: 6 - 22 (calc)   Sodium 136 135 - 146 mmol/L   Potassium 5.5 (H) 3.5 - 5.3 mmol/L   Chloride 100 98 - 110 mmol/L   CO2 27 20 - 32 mmol/L   Calcium 10.1 8.6 - 10.3 mg/dL   Total Protein 7.1 6.1 - 8.1 g/dL   Albumin 4.2 3.6 - 5.1 g/dL   Globulin 2.9 1.9 - 3.7 g/dL (calc)   AG Ratio 1.4 1.0 - 2.5 (calc)   Total Bilirubin 0.5 0.2 - 1.2 mg/dL   Alkaline phosphatase (APISO) 55 36 - 130 U/L   AST 13 10 - 40 U/L   ALT 10 9 - 46 U/L  Lipid panel   Collection Time: 12/31/22  8:02 AM  Result  Value Ref Range   Cholesterol 80 <200 mg/dL   HDL 41 > OR = 40 mg/dL   Triglycerides 50 <621 mg/dL   LDL Cholesterol (Calc) 27 mg/dL (calc)   Total CHOL/HDL Ratio 2.0 <5.0 (calc)   Non-HDL Cholesterol (Calc) 39 <308 mg/dL (calc)  PSA   Collection Time: 12/31/22  8:02 AM  Result Value Ref Range   PSA 0.73 < OR = 4.00 ng/mL  Hemoglobin A1c   Collection Time: 12/31/22  8:02 AM  Result Value Ref Range   Hgb A1c MFr Bld 6.1 (H) <5.7 % of total Hgb   Mean Plasma Glucose 128 mg/dL   eAG (mmol/L) 7.1 mmol/L   {Labs (Optional):23779}    Assessment & Plan:   Problem List Items Addressed This Visit   None    Assessment and Plan             Follow up plan: No follow-ups on file.

## 2023-04-30 ENCOUNTER — Encounter: Payer: Self-pay | Admitting: Nurse Practitioner

## 2023-04-30 ENCOUNTER — Ambulatory Visit (INDEPENDENT_AMBULATORY_CARE_PROVIDER_SITE_OTHER): Payer: 59 | Admitting: Nurse Practitioner

## 2023-04-30 ENCOUNTER — Other Ambulatory Visit: Payer: Self-pay

## 2023-04-30 VITALS — BP 116/78 | HR 72 | Resp 18 | Ht 69.0 in | Wt 156.0 lb

## 2023-04-30 DIAGNOSIS — I10 Essential (primary) hypertension: Secondary | ICD-10-CM

## 2023-04-30 DIAGNOSIS — E1165 Type 2 diabetes mellitus with hyperglycemia: Secondary | ICD-10-CM | POA: Diagnosis not present

## 2023-04-30 DIAGNOSIS — E7849 Other hyperlipidemia: Secondary | ICD-10-CM

## 2023-04-30 LAB — POCT GLYCOSYLATED HEMOGLOBIN (HGB A1C): Hemoglobin A1C: 5.5 % (ref 4.0–5.6)

## 2023-04-30 MED ORDER — TIRZEPATIDE 5 MG/0.5ML ~~LOC~~ SOAJ
5.0000 mg | SUBCUTANEOUS | 1 refills | Status: DC
  Filled 2023-04-30: qty 6, 84d supply, fill #0
  Filled 2023-07-14: qty 6, 84d supply, fill #1

## 2023-05-16 ENCOUNTER — Encounter: Payer: Self-pay | Admitting: Nurse Practitioner

## 2023-05-30 ENCOUNTER — Encounter: Payer: Self-pay | Admitting: Nurse Practitioner

## 2023-08-12 ENCOUNTER — Other Ambulatory Visit: Payer: Self-pay | Admitting: Nurse Practitioner

## 2023-08-12 ENCOUNTER — Other Ambulatory Visit: Payer: Self-pay

## 2023-08-12 DIAGNOSIS — E1165 Type 2 diabetes mellitus with hyperglycemia: Secondary | ICD-10-CM

## 2023-08-13 ENCOUNTER — Other Ambulatory Visit: Payer: Self-pay

## 2023-08-14 ENCOUNTER — Other Ambulatory Visit: Payer: Self-pay

## 2023-08-14 MED FILL — Glucose Blood Test Strip: 90 days supply | Qty: 100 | Fill #0 | Status: AC

## 2023-08-14 NOTE — Telephone Encounter (Signed)
 Requested medication (s) are due for refill today:   Requested medication (s) are on the active medication list: nO  Last refill:  ?  Future visit scheduled: Yes  Notes to clinic:  See request, not on list.    Requested Prescriptions  Pending Prescriptions Disp Refills   FREESTYLE LITE test strip [Pharmacy Med Name: glucose blood test strip] 100 each 11    Sig: Use daily as directed.     Endocrinology: Diabetes - Testing Supplies Passed - 08/14/2023  1:45 PM      Passed - Valid encounter within last 12 months    Recent Outpatient Visits           3 months ago Primary hypertension   [facility] Quinton Buckler, FNP       Future Appointments             In 6 days Abram Hoguet, Monalisa Angles, FNP [facility], [facility]

## 2023-08-15 ENCOUNTER — Other Ambulatory Visit: Payer: Self-pay

## 2023-08-20 ENCOUNTER — Encounter: Payer: Self-pay | Admitting: Nurse Practitioner

## 2023-08-20 ENCOUNTER — Ambulatory Visit: Admitting: Nurse Practitioner

## 2023-08-20 VITALS — BP 124/80 | HR 87 | Temp 97.8°F | Resp 18 | Ht 69.0 in | Wt 157.0 lb

## 2023-08-20 DIAGNOSIS — E7849 Other hyperlipidemia: Secondary | ICD-10-CM | POA: Diagnosis not present

## 2023-08-20 DIAGNOSIS — Z7985 Long-term (current) use of injectable non-insulin antidiabetic drugs: Secondary | ICD-10-CM

## 2023-08-20 DIAGNOSIS — E1169 Type 2 diabetes mellitus with other specified complication: Secondary | ICD-10-CM | POA: Diagnosis not present

## 2023-08-20 DIAGNOSIS — I10 Essential (primary) hypertension: Secondary | ICD-10-CM

## 2023-08-20 NOTE — Progress Notes (Signed)
 BP 124/80   Pulse 87   Temp 97.8 F (36.6 C)   Resp 18   Ht 5' 9 (1.753 m)   Wt 157 lb (71.2 kg)   SpO2 98%   BMI 23.18 kg/m    Subjective:    Patient ID: Eric Carr, male    DOB: 1975-05-29, 48 y.o.   MRN: [id number]  HPI: Eric Carr is a 48 y.o. male  Chief Complaint  Patient presents with   Medical Management of Chronic Issues    Discussed the use of AI scribe software for clinical note transcription with the patient, who gave verbal consent to proceed.  History of Present Illness Eric Carr is a 48 year old male with hypertension, type 2 diabetes, and hyperlipidemia who presents for routine follow-up.  He has a history of hypertension, type 2 diabetes, and hyperlipidemia. He is currently taking Mounjaro  5 mg weekly. His blood sugar levels in the morning range between 90 and 115 mg/dL, with a recent reading of 110 mg/dL. His last A1c was 5.5%.  He completed his diabetic eye exam in February at [facility], although there seems to be a discrepancy in the records. No new symptoms or changes in his condition. He is not experiencing any sores on his feet and has no new concerns to report.  He has not been on vacation due to work commitments and reports being short-staffed at work.         08/20/2023    8:06 AM 04/30/2023    8:20 AM 01/02/2023    8:01 AM  Depression screen PHQ 2/9  Decreased Interest 0 0 0  Down, Depressed, Hopeless 0 0 0  PHQ - 2 Score 0 0 0  Altered sleeping 0    Tired, decreased energy 0    Change in appetite 0    Feeling bad or failure about yourself  0    Trouble concentrating 0    Moving slowly or fidgety/restless 0    Suicidal thoughts 0    PHQ-9 Score 0    Difficult doing work/chores Not difficult at all      Relevant past medical, surgical, family and social history reviewed and updated as indicated. Interim medical history since our last visit reviewed. Allergies and medications reviewed and updated.  Review of  Systems  Constitutional: Negative for fever or weight change.  Respiratory: Negative for cough and shortness of breath.   Cardiovascular: Negative for chest pain or palpitations.  Gastrointestinal: Negative for abdominal pain, no bowel changes.  Musculoskeletal: Negative for gait problem or joint swelling.  Skin: Negative for rash.  Neurological: Negative for dizziness or headache.  No other specific complaints in a complete review of systems (except as listed in HPI above).      Objective:     BP 124/80   Pulse 87   Temp 97.8 F (36.6 C)   Resp 18   Ht 5' 9 (1.753 m)   Wt 157 lb (71.2 kg)   SpO2 98%   BMI 23.18 kg/m    Wt Readings from Last 3 Encounters:  08/20/23 157 lb (71.2 kg)  04/30/23 156 lb (70.8 kg)  01/02/23 155 lb 11.2 oz (70.6 kg)    Physical Exam Physical Exam VITALS: BP- 124/80 GENERAL: Alert, cooperative, well developed, no acute distress. HEENT: Normocephalic, normal oropharynx, moist mucous membranes. CHEST: Clear to auscultation bilaterally, no wheezes, rhonchi, or crackles. CARDIOVASCULAR: Normal heart rate and rhythm, S1 and S2 normal without murmurs. ABDOMEN: Soft, non-tender, non-distended,  without organomegaly, normal bowel sounds. EXTREMITIES: No cyanosis or edema. NEUROLOGICAL: Cranial nerves grossly intact, moves all extremities without gross motor or sensory deficit.   Diabetic Foot Exam - Simple   Simple Foot Form Diabetic Foot exam was performed with the following findings: Yes 08/20/2023  8:18 AM  Visual Inspection No deformities, no ulcerations, no other skin breakdown bilaterally: Yes Sensation Testing Intact to touch and monofilament testing bilaterally: Yes Pulse Check Posterior Tibialis and Dorsalis pulse intact bilaterally: Yes Comments     Results for orders placed or performed in visit on 04/30/23  POCT HgB A1C   Collection Time: 04/30/23  8:21 AM  Result Value Ref Range   Hemoglobin A1C 5.5 4.0 - 5.6 %   HbA1c POC (<>  result, manual entry)     HbA1c, POC (prediabetic range)     HbA1c, POC (controlled diabetic range)            Assessment & Plan:   Problem List Items Addressed This Visit       Cardiovascular and Mediastinum   Hypertension - Primary   Relevant Orders   CBC with Differential/Platelet   Comprehensive metabolic panel with GFR     Endocrine   Diabetes (HCC)   Relevant Orders   Comprehensive metabolic panel with GFR   Microalbumin / creatinine urine ratio   Hemoglobin A1c   HM Diabetes Foot Exam (Completed)     Other   Other hyperlipidemia   Relevant Orders   Comprehensive metabolic panel with GFR   Lipid panel     Assessment and Plan Assessment & Plan Type 2 diabetes mellitus Type 2 diabetes mellitus is well-controlled with Mounjaro . Blood sugar levels range from 90 to 115 mg/dL in the mornings. Last A1c was 5.5%. - Continue Mounjaro  5 mg weekly. - Verify completion of diabetic eye exam with [facility].  Hypertension Hypertension is well-controlled with a current blood pressure of 124/80 mmHg.  Hyperlipidemia Last lipid panel was within normal range. He is currently not taking atorvastatin . - Order full lipid panel.        Follow up plan: Return in about 4 months (around 12/20/2023) for follow up.

## 2023-08-21 ENCOUNTER — Ambulatory Visit: Payer: Self-pay | Admitting: Nurse Practitioner

## 2023-08-21 LAB — CBC WITH DIFFERENTIAL/PLATELET
Absolute Lymphocytes: 2156 {cells}/uL (ref 850–3900)
Absolute Monocytes: 595 {cells}/uL (ref 200–950)
Basophils Absolute: 112 {cells}/uL (ref 0–200)
Basophils Relative: 1.6 %
Eosinophils Absolute: 350 {cells}/uL (ref 15–500)
Eosinophils Relative: 5 %
HCT: 45.4 % (ref 38.5–50.0)
Hemoglobin: 15 g/dL (ref 13.2–17.1)
MCH: 29.5 pg (ref 27.0–33.0)
MCHC: 33 g/dL (ref 32.0–36.0)
MCV: 89.4 fL (ref 80.0–100.0)
MPV: 9.5 fL (ref 7.5–12.5)
Monocytes Relative: 8.5 %
Neutro Abs: 3787 {cells}/uL (ref 1500–7800)
Neutrophils Relative %: 54.1 %
Platelets: 280 10*3/uL (ref 140–400)
RBC: 5.08 10*6/uL (ref 4.20–5.80)
RDW: 12.7 % (ref 11.0–15.0)
Total Lymphocyte: 30.8 %
WBC: 7 10*3/uL (ref 3.8–10.8)

## 2023-08-21 LAB — COMPREHENSIVE METABOLIC PANEL WITH GFR
AG Ratio: 1.8 (calc) (ref 1.0–2.5)
ALT: 13 U/L (ref 9–46)
AST: 12 U/L (ref 10–40)
Albumin: 4.2 g/dL (ref 3.6–5.1)
Alkaline phosphatase (APISO): 61 U/L (ref 36–130)
BUN: 23 mg/dL (ref 7–25)
CO2: 25 mmol/L (ref 20–32)
Calcium: 9 mg/dL (ref 8.6–10.3)
Chloride: 108 mmol/L (ref 98–110)
Creat: 0.97 mg/dL (ref 0.60–1.29)
Globulin: 2.3 g/dL (ref 1.9–3.7)
Glucose, Bld: 132 mg/dL — ABNORMAL HIGH (ref 65–99)
Potassium: 4.1 mmol/L (ref 3.5–5.3)
Sodium: 141 mmol/L (ref 135–146)
Total Bilirubin: 0.3 mg/dL (ref 0.2–1.2)
Total Protein: 6.5 g/dL (ref 6.1–8.1)
eGFR: 96 mL/min/{1.73_m2} (ref >=60)

## 2023-08-21 LAB — LIPID PANEL
Cholesterol: 135 mg/dL (ref <200)
HDL: 44 mg/dL (ref >=40)
LDL Cholesterol (Calc): 76 mg/dL
Non-HDL Cholesterol (Calc): 91 mg/dL (ref <130)
Total CHOL/HDL Ratio: 3.1 (calc) (ref <5.0)
Triglycerides: 71 mg/dL (ref <150)

## 2023-08-21 LAB — HEMOGLOBIN A1C
Hgb A1c MFr Bld: 5.6 % (ref <5.7)
Mean Plasma Glucose: 114 mg/dL
eAG (mmol/L): 6.3 mmol/L

## 2023-08-21 LAB — MICROALBUMIN / CREATININE URINE RATIO
Creatinine, Urine: 102 mg/dL (ref 20–320)
Microalb Creat Ratio: 2 mg/g{creat} (ref <30)
Microalb, Ur: 0.2 mg/dL

## 2023-10-11 ENCOUNTER — Other Ambulatory Visit: Payer: Self-pay | Admitting: Nurse Practitioner

## 2023-10-11 ENCOUNTER — Other Ambulatory Visit: Payer: Self-pay

## 2023-10-11 ENCOUNTER — Encounter: Payer: Self-pay | Admitting: Nurse Practitioner

## 2023-10-11 ENCOUNTER — Ambulatory Visit (INDEPENDENT_AMBULATORY_CARE_PROVIDER_SITE_OTHER): Admitting: Nurse Practitioner

## 2023-10-11 VITALS — BP 110/74 | HR 78 | Temp 98.0°F | Resp 16 | Ht 69.0 in | Wt 163.6 lb

## 2023-10-11 DIAGNOSIS — E1165 Type 2 diabetes mellitus with hyperglycemia: Secondary | ICD-10-CM

## 2023-10-11 DIAGNOSIS — Z0000 Encounter for general adult medical examination without abnormal findings: Secondary | ICD-10-CM | POA: Diagnosis not present

## 2023-10-11 NOTE — Progress Notes (Signed)
 Name: Eric Carr   MRN: [id number]    DOB: 11/14/1975   Date:10/11/2023       Progress Note  Subjective  Chief Complaint  Chief Complaint  Patient presents with   Annual Exam    HPI  Patient presents for annual CPE . Discussed the use of AI scribe software for clinical note transcription with the patient, who gave verbal consent to proceed.  History of Present Illness Eric Carr is a 48 year old male who presents for a comprehensive physical exam.  Metabolic syndrome management - Hypertension, diabetes mellitus, and hyperlipidemia present - Recent laboratory results: A1c 5.6, LDL 76, normal urine microalbumin - Currently taking Mounjaro  5 mg weekly for diabetes management - Inquires about necessity of morning blood glucose monitoring  Weight gain and lifestyle factors - Gained approximately six pounds over the past two and a half months - Attributes weight gain to changes in work schedule and meal plan due to working second shift - Follows a regular, well-balanced diet - Not currently engaging in any exercise  Sleep patterns - Averages six hours of sleep per night  Preventive care - Dental and eye examinations completed within the last two years - Depression screening negative    IPSS     Row Name 10/11/23 0735         International Prostate Symptom Score   How often have you had the sensation of not emptying your bladder? Not at All     How often have you had to urinate less than every two hours? Not at All     How often have you found you stopped and started again several times when you urinated? Not at All     How often have you found it difficult to postpone urination? Not at All     How often have you had a weak urinary stream? Not at All     How often have you had to strain to start urination? About half the time     Total IPSS Score 3       Quality of Life due to urinary symptoms   If you were to spend the rest of your life with your urinary  condition just the way it is now how would you feel about that? Delighted         Diet: Regular, well balanced Exercise: none, recommend 150 min of physical activity weekly   Sleep: 6 hours Last dental exam:completed Last eye exam: completed  Depression: phq 9 is negative    10/11/2023    7:27 AM 08/20/2023    8:06 AM 04/30/2023    8:20 AM 01/02/2023    8:01 AM 12/31/2022    7:16 AM  Depression screen PHQ 2/9  Decreased Interest 0 0 0 0 0  Down, Depressed, Hopeless 0 0 0 0 0  PHQ - 2 Score 0 0 0 0 0  Altered sleeping  0     Tired, decreased energy  0     Change in appetite  0     Feeling bad or failure about yourself   0     Trouble concentrating  0     Moving slowly or fidgety/restless  0     Suicidal thoughts  0     PHQ-9 Score  0     Difficult doing work/chores  Not difficult at all       Hypertension:  BP Readings from Last 3 Encounters:  10/11/23 110/74  08/20/23 124/80  04/30/23 116/78    Obesity: Wt Readings from Last 3 Encounters:  10/11/23 163 lb 9.6 oz (74.2 kg)  08/20/23 157 lb (71.2 kg)  04/30/23 156 lb (70.8 kg)   BMI Readings from Last 3 Encounters:  10/11/23 24.16 kg/m  08/20/23 23.18 kg/m  04/30/23 23.04 kg/m    Flowsheet Row Office Visit from 10/11/2023 in [facility]  1 31 inches     Lipids:  Lab Results  Component Value Date   CHOL 135 08/20/2023   CHOL 80 12/31/2022   CHOL 174 06/28/2022   Lab Results  Component Value Date   HDL 44 08/20/2023   HDL 41 12/31/2022   HDL 36 (L) 06/28/2022   Lab Results  Component Value Date   LDLCALC 76 08/20/2023   LDLCALC 27 12/31/2022   LDLCALC  06/28/2022     Comment:     . LDL cholesterol not calculated. Triglyceride levels greater than 400 mg/dL invalidate calculated LDL results. . Reference range: <100 . Desirable range <100 mg/dL for primary prevention;   <70 mg/dL for patients with CHD or diabetic patients  with > or = 2 CHD risk factors. SABRA LDL-C is  now calculated using the Martin-Hopkins  calculation, which is a validated novel method providing  better accuracy than the Friedewald equation in the  estimation of LDL-C.  Gladis APPLETHWAITE et al. SANDREA. 7986;689(80): 2061-2068  (http://education.[facility].com/faq/FAQ164)    Lab Results  Component Value Date   TRIG 71 08/20/2023   TRIG 50 12/31/2022   TRIG 436 (H) 06/28/2022   Lab Results  Component Value Date   CHOLHDL 3.1 08/20/2023   CHOLHDL 2.0 12/31/2022   CHOLHDL 4.8 06/28/2022   No results found for: LDLDIRECT Glucose:  Glucose, Bld  Date Value Ref Range Status  08/20/2023 132 (H) 65 - 99 mg/dL Final    Comment:    .            Fasting reference interval . For someone without known diabetes, a glucose value >125 mg/dL indicates that they may have diabetes and this should be confirmed with a follow-up test. .   12/31/2022 101 (H) 65 - 99 mg/dL Final    Comment:    .            Fasting reference interval . For someone without known diabetes, a glucose value between 100 and 125 mg/dL is consistent with prediabetes and should be confirmed with a follow-up test. .   06/28/2022 514 (HH) 65 - 99 mg/dL Final    Comment:    Verified by repeat analysis. SABRA .            Fasting reference interval . For someone without known diabetes, a glucose value >125 mg/dL indicates that they may have diabetes and this should be confirmed with a follow-up test. .    Glucose-Capillary  Date Value Ref Range Status  07/18/2022 128 (H) 70 - 99 mg/dL Final    Comment:    Glucose reference range applies only to samples taken after fasting for at least 8 hours.    Flowsheet Row Office Visit from 10/11/2023 in [facility]  AUDIT-C Score 0    Married STD testing and prevention (HIV/chl/gon/syphilis): completed Hep C: completed  Skin cancer: Discussed monitoring for atypical lesions Colorectal cancer: 07/18/2022 Prostate cancer:  12/31/2022 Lab Results  Component Value Date   PSA 0.73 12/31/2022     Lung cancer:   Low Dose CT Chest recommended  if Age 11-80 years, 30 pack-year currently smoking OR have quit w/in 15years. Patient does not qualify.   AAA:  The USPSTF recommends one-time screening with ultrasonography in men ages 93 to 86 years who have ever smoked ECG:  02/15/2020  Vaccines:  HPV: up to at age 60 , ask insurance if age between 32-45  Shingrix: 10-64 yo and ask insurance if covered when patient above 26 yo Pneumonia:  educated and discussed with patient. Flu:  educated and discussed with patient.  Advanced Care Planning: A voluntary discussion about advance care planning including the explanation and discussion of advance directives.  Discussed health care proxy and Living will, and the patient was able to identify a health care proxy as wife.  Patient does not have a living will at present time. If patient does have living will, I have requested they bring this to the clinic to be scanned in to their chart.  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   Hypertension 01/12/2021   Impingement syndrome of shoulder region 07/31/2019   Diabetes (HCC) 11/26/2018   Overweight (BMI 25.0-29.9) 11/26/2018   Hypogonadism in male 11/26/2018   Other hyperlipidemia 11/26/2018    Past Surgical History:  Procedure Laterality Date   APPENDECTOMY     CHOLECYSTECTOMY     COLONOSCOPY WITH PROPOFOL  N/A 07/18/2022   Procedure: COLONOSCOPY WITH PROPOFOL ;  Surgeon: Therisa Bi, MD;  Location: [facility];  Service: Gastroenterology;  Laterality: N/A;   KNEE SURGERY Left    TONSILECTOMY, ADENOIDECTOMY, BILATERAL MYRINGOTOMY AND TUBES Bilateral    TONSILLECTOMY      No family history on file.  Social History    Socioeconomic History   Marital status: Married    Spouse name: Not on file   Number of children: 1   Years of education: Not on file   Highest education level: GED or equivalent  Occupational History   Not on file  Tobacco Use   Smoking status: Never   Smokeless tobacco: Never  Vaping Use   Vaping status: Never Used  Substance and Sexual Activity   Alcohol use: Not Currently    Comment: 1-2 drinks per year on average   Drug use: Not Currently   Sexual activity: Yes  Other Topics Concern   Not on file  Social History Narrative   Works at [employer]: Low Risk  (04/29/2023)   Overall Financial Resource Strain (CARDIA)    Difficulty of Paying Living Expenses: Not hard at all  Food Insecurity: No Food Insecurity (10/11/2023)   Hunger Vital Sign    Worried About Running Out of Food in the Last Year: Never true    Ran Out of Food in the Last Year: Never true  Transportation Needs: No Transportation Needs (10/11/2023)   PRAPARE - Administrator, Civil Service (Medical): No    Lack of Transportation (Non-Medical): No  Physical Activity: Sufficiently Active (04/29/2023)   Exercise Vital Sign    Days of Exercise per Week: 5 days    Minutes of Exercise per Session: 120 min  Stress: No Stress Concern Present (04/29/2023)   [employer] of Occupational Health - Occupational Stress Questionnaire    Feeling of Stress : Not at all  Social Connections: Unknown (10/11/2023)   Social  Connection and Isolation Panel    Frequency of Communication with Friends and Family: More than three times a week    Frequency of Social Gatherings with Friends and Family: More than three times a week    Attends Religious Services: More than 4 times per year    Active Member of [employer] or Organizations: Yes    Attends Banker Meetings: More than 4 times per year    Marital Status: Not on file  Intimate Partner Violence: Not At Risk  (10/10/2022)   Humiliation, Afraid, Rape, and Kick questionnaire    Fear of Current or Ex-Partner: No    Emotionally Abused: No    Physically Abused: No    Sexually Abused: No     Current Outpatient Medications:    tirzepatide  (MOUNJARO ) 5 MG/0.5ML Pen, Inject 5 mg into the skin once a week., Disp: 6 mL, Rfl: 1   glucose blood (FREESTYLE LITE) test strip, Use daily as directed., Disp: 100 each, Rfl: 11  Allergies  Allergen Reactions   Zithromax [Azithromycin] Dermatitis     ROS  Constitutional: Negative for fever or weight change.  Respiratory: Negative for cough and shortness of breath.   Cardiovascular: Negative for chest pain or palpitations.  Gastrointestinal: Negative for abdominal pain, no bowel changes.  Musculoskeletal: Negative for gait problem or joint swelling.  Skin: Negative for rash.  Neurological: Negative for dizziness or headache.  No other specific complaints in a complete review of systems (except as listed in HPI above).    Objective  Vitals:   10/11/23 0728  BP: 110/74  Pulse: 78  Resp: 16  Temp: 98 F (36.7 C)  TempSrc: Oral  SpO2: 99%  Weight: 163 lb 9.6 oz (74.2 kg)  Height: 5' 9 (1.753 m)    Body mass index is 24.16 kg/m.  Physical Exam Vitals reviewed.  Constitutional:      Appearance: Normal appearance.  HENT:     Head: Normocephalic.     Right Ear: Tympanic membrane normal.     Left Ear: Tympanic membrane normal.     Nose: Nose normal.  Eyes:     Extraocular Movements: Extraocular movements intact.     Conjunctiva/sclera: Conjunctivae normal.     Pupils: Pupils are equal, round, and reactive to light.  Neck:     Thyroid: No thyroid mass, thyromegaly or thyroid tenderness.  Cardiovascular:     Rate and Rhythm: Normal rate and regular rhythm.     Pulses: Normal pulses.     Heart sounds: Normal heart sounds.  Pulmonary:     Effort: Pulmonary effort is normal.     Breath sounds: Normal breath sounds.  Abdominal:      General: Bowel sounds are normal.     Palpations: Abdomen is soft.  Musculoskeletal:        General: Normal range of motion.     Cervical back: Normal range of motion and neck supple.     Right lower leg: No edema.     Left lower leg: No edema.  Skin:    General: Skin is warm and dry.     Capillary Refill: Capillary refill takes less than 2 seconds.  Neurological:     General: No focal deficit present.     Mental Status: He is alert and oriented to person, place, and time. Mental status is at baseline.  Psychiatric:        Mood and Affect: Mood normal.        Behavior: Behavior  normal.        Thought Content: Thought content normal.        Judgment: Judgment normal.      Recent Results (from the past 2160 hours)  CBC with Differential/Platelet     Status: None   Collection Time: 08/20/23  8:22 AM  Result Value Ref Range   WBC 7.0 3.8 - 10.8 Thousand/uL   RBC 5.08 4.20 - 5.80 Million/uL   Hemoglobin 15.0 13.2 - 17.1 g/dL   HCT 54.5 61.4 - 49.9 %   MCV 89.4 80.0 - 100.0 fL   MCH 29.5 27.0 - 33.0 pg   MCHC 33.0 32.0 - 36.0 g/dL    Comment: For adults, a slight decrease in the calculated MCHC value (in the range of 30 to 32 g/dL) is most likely not clinically significant; however, it should be interpreted with caution in correlation with other red cell parameters and the patient's clinical condition.    RDW 12.7 11.0 - 15.0 %   Platelets 280 140 - 400 Thousand/uL   MPV 9.5 7.5 - 12.5 fL   Neutro Abs 3,787 1,500 - 7,800 cells/uL   Absolute Lymphocytes 2,156 850 - 3,900 cells/uL   Absolute Monocytes 595 200 - 950 cells/uL   Eosinophils Absolute 350 15 - 500 cells/uL   Basophils Absolute 112 0 - 200 cells/uL   Neutrophils Relative % 54.1 %   Total Lymphocyte 30.8 %   Monocytes Relative 8.5 %   Eosinophils Relative 5.0 %   Basophils Relative 1.6 %  Comprehensive metabolic panel with GFR     Status: Abnormal   Collection Time: 08/20/23  8:22 AM  Result Value Ref Range    Glucose, Bld 132 (H) 65 - 99 mg/dL    Comment: .            Fasting reference interval . For someone without known diabetes, a glucose value >125 mg/dL indicates that they may have diabetes and this should be confirmed with a follow-up test. .    BUN 23 7 - 25 mg/dL   Creat 9.02 9.39 - 8.70 mg/dL   eGFR 96 > OR = 60 fO/fpw/8.26f7   BUN/Creatinine Ratio SEE NOTE: 6 - 22 (calc)    Comment:    Not Reported: BUN and Creatinine are within    reference range. .    Sodium 141 135 - 146 mmol/L   Potassium 4.1 3.5 - 5.3 mmol/L   Chloride 108 98 - 110 mmol/L   CO2 25 20 - 32 mmol/L   Calcium  9.0 8.6 - 10.3 mg/dL   Total Protein 6.5 6.1 - 8.1 g/dL   Albumin 4.2 3.6 - 5.1 g/dL   Globulin 2.3 1.9 - 3.7 g/dL (calc)   AG Ratio 1.8 1.0 - 2.5 (calc)   Total Bilirubin 0.3 0.2 - 1.2 mg/dL   Alkaline phosphatase (APISO) 61 36 - 130 U/L   AST 12 10 - 40 U/L   ALT 13 9 - 46 U/L  Lipid panel     Status: None   Collection Time: 08/20/23  8:22 AM  Result Value Ref Range   Cholesterol 135 <200 mg/dL   HDL 44 > OR = 40 mg/dL   Triglycerides 71 <849 mg/dL   LDL Cholesterol (Calc) 76 mg/dL (calc)    Comment: Reference range: <100 . Desirable range <100 mg/dL for primary prevention;   <70 mg/dL for patients with CHD or diabetic patients  with > or = 2 CHD risk factors. SABRA LDL-C is  now calculated using the Martin-Hopkins  calculation, which is a validated novel method providing  better accuracy than the Friedewald equation in the  estimation of LDL-C.  Gladis APPLETHWAITE et al. SANDREA. 7986;689(80): 2061-2068  (http://education.[facility].com/faq/FAQ164)    Total CHOL/HDL Ratio 3.1 <5.0 (calc)   Non-HDL Cholesterol (Calc) 91 <869 mg/dL (calc)    Comment: For patients with diabetes plus 1 major ASCVD risk  factor, treating to a non-HDL-C goal of <100 mg/dL  (LDL-C of <29 mg/dL) is considered a therapeutic  option.   Hemoglobin A1c     Status: None   Collection Time: 08/20/23  8:22 AM  Result  Value Ref Range   Hgb A1c MFr Bld 5.6 <5.7 %    Comment: For the purpose of screening for the presence of diabetes: . <5.7%       Consistent with the absence of diabetes 5.7-6.4%    Consistent with increased risk for diabetes             (prediabetes) > or =6.5%  Consistent with diabetes . This assay result is consistent with a decreased risk of diabetes. . Currently, no consensus exists regarding use of hemoglobin A1c for diagnosis of diabetes in children. . According to American Diabetes Association (ADA) guidelines, hemoglobin A1c <7.0% represents optimal control in non-pregnant diabetic patients. Different metrics may apply to specific patient populations.  Standards of Medical Care in Diabetes(ADA). .    Mean Plasma Glucose 114 mg/dL   eAG (mmol/L) 6.3 mmol/L  Microalbumin / creatinine urine ratio     Status: None   Collection Time: 08/20/23  8:31 AM  Result Value Ref Range   Creatinine, Urine 102 20 - 320 mg/dL   Microalb, Ur 0.2 mg/dL    Comment: Reference Range Not established    Microalb Creat Ratio 2 <30 mg/g creat    Comment: . The ADA defines abnormalities in albumin excretion as follows: SABRA Albuminuria Category        Result (mg/g creatinine) . Normal to Mildly increased   <30 Moderately increased         30-299  Severely increased           > OR = 300 . The ADA recommends that at least two of three specimens collected within a 3-6 month period be abnormal before considering a patient to be within a diagnostic category.      Fall Risk:    08/20/2023    8:06 AM 04/30/2023    8:08 AM 01/02/2023    8:01 AM 12/31/2022    7:16 AM 10/10/2022    7:37 AM  Fall Risk   Falls in the past year? 0 0 0 0 0  Number falls in past yr: 0 0 0 0 0  Injury with Fall? 0 0 0 0 0  Risk for fall due to :   No Fall Risks No Fall Risks   Follow up Falls evaluation completed Falls evaluation completed Falls prevention discussed Falls prevention discussed        Functional Status Survey: Is the patient deaf or have difficulty hearing?: No Does the patient have difficulty seeing, even when wearing glasses/contacts?: No Does the patient have difficulty concentrating, remembering, or making decisions?: No Does the patient have difficulty walking or climbing stairs?: No Does the patient have difficulty dressing or bathing?: No Does the patient have difficulty doing errands alone such as visiting a doctor's office or shopping?: No    Assessment & Plan  Problem List Items Addressed  This Visit   None Visit Diagnoses       Annual physical exam    -  Primary      Assessment and Plan Assessment & Plan Adult Wellness Visit Routine adult wellness visit with well-controlled blood pressure at 124/80 mmHg, BMI of 24.16, and waist circumference of 31 inches. Depression screening is negative. Recent dental and eye exams are current. IPSS score for prostate health is satisfactory with no urinary issues or pain. Heart, lung, and neurological examinations are normal. - Encourage a well-balanced diet. - Recommend 150 minutes of physical activity per week. - Discussed pneumonia vaccination, declined. - Plan to check PSA levels at the next visit.  Hypertension Blood pressure reading is 124/80 mmHg.  Type 2 diabetes mellitus Type 2 diabetes mellitus is well-controlled with an A1c of 5.6. Managed with Mounjaro  5 mg weekly. No need for routine blood sugar monitoring unless symptoms arise or A1c increases. - Continue Mounjaro  5 mg weekly. - Advise against routine blood sugar monitoring unless symptomatic or A1c increases.  Hyperlipidemia Hyperlipidemia is well-managed with an LDL of 76 mg/dL. Current treatment appears effective.     -Prostate cancer screening and PSA options (with potential risks and benefits of testing vs not testing) were discussed along with recent recs/guidelines. -USPSTF grade A and B recommendations reviewed with patient;  age-appropriate recommendations, preventive care, screening tests, etc discussed and encouraged; healthy living encouraged; see AVS for patient education given to patient -Discussed importance of 150 minutes of physical activity weekly, eat two servings of fish weekly, eat one serving of tree nuts ( cashews, pistachios, pecans, almonds.SABRA) every other day, eat 6 servings of fruit/vegetables daily and drink plenty of water and avoid sweet beverages.  -Reviewed Health Maintenance: yes

## 2023-10-14 ENCOUNTER — Other Ambulatory Visit: Payer: Self-pay

## 2023-10-14 NOTE — Telephone Encounter (Signed)
 Requested medications are due for refill today.  yes  Requested medications are on the active medications list.  yes  Last refill. 04/2023  Future visit scheduled.   yes  Notes to clinic.  Medication not assigned to a protocol. Please review for refill.    Requested Prescriptions  Pending Prescriptions Disp Refills   MOUNJARO  5 MG/0.5ML Pen [Pharmacy Med Name: tirzepatide  (MOUNJARO ) 5 MG/0.5ML Pen] 6 mL 1    Sig: Inject 5 mg into the skin once a week.     Off-Protocol Failed - 10/14/2023  5:17 PM      Failed - Medication not assigned to a protocol, review manually.      Passed - Valid encounter within last 12 months    Recent Outpatient Visits           3 days ago Annual physical exam   [facility] Gareth Mliss FALCON, FNP   1 month ago Primary hypertension   [facility] Gareth Mliss FALCON, FNP   5 months ago Primary hypertension   [facility] Gareth Mliss FALCON, FNP       Future Appointments             In 1 year Gareth, Mliss FALCON, FNP [facility], [facility]

## 2023-10-15 ENCOUNTER — Other Ambulatory Visit: Payer: Self-pay

## 2023-10-15 ENCOUNTER — Other Ambulatory Visit: Payer: Self-pay | Admitting: Nurse Practitioner

## 2023-10-15 DIAGNOSIS — E1165 Type 2 diabetes mellitus with hyperglycemia: Secondary | ICD-10-CM

## 2023-10-16 ENCOUNTER — Encounter: Payer: Self-pay | Admitting: Nurse Practitioner

## 2023-10-16 ENCOUNTER — Other Ambulatory Visit: Payer: Self-pay

## 2023-10-16 NOTE — Telephone Encounter (Signed)
Duplicate  This encounter was created in error - please disregard. 

## 2023-10-16 NOTE — Telephone Encounter (Signed)
 Copied from CRM [id number]. Topic: Clinical - Medication Refill >> Oct 16, 2023 10:37 AM Rosina BIRCH wrote: Medication: tirzepatide  (MOUNJARO ) 5 MG/0.5ML Pen   Has the patient contacted their pharmacy? Yes (Agent: If no, request that the patient contact the pharmacy for the refill. If patient does not wish to contact the pharmacy document the reason why and proceed with request.) (Agent: If yes, when and what did the pharmacy advise?)  This is the patient's preferred pharmacy:  [facility] [address] [place] [address] Phone: [phone] Fax: [phone]  Is this the correct pharmacy for this prescription? Yes If no, delete pharmacy and type the correct one.   Has the prescription been filled recently? No  Is the patient out of the medication? Yes  Has the patient been seen for an appointment in the last year OR does the patient have an upcoming appointment? No  Can we respond through MyChart? Yes  Agent: Please be advised that Rx refills may take up to 3 business days. We ask that you follow-up with your pharmacy.

## 2023-10-17 ENCOUNTER — Other Ambulatory Visit: Payer: Self-pay

## 2023-10-17 MED FILL — Tirzepatide Soln Auto-injector 5 MG/0.5ML: SUBCUTANEOUS | 84 days supply | Qty: 6 | Fill #0 | Status: AC

## 2023-10-17 NOTE — Telephone Encounter (Signed)
 Requested medication (s) are due for refill today: yes  Requested medication (s) are on the active medication list: yes  Last refill:  04/30/23  Future visit scheduled: yes  Notes to clinic:   Medication not assigned to a protocol, review manually.      Requested Prescriptions  Pending Prescriptions Disp Refills   MOUNJARO  5 MG/0.5ML Pen [Pharmacy Med Name: tirzepatide  (MOUNJARO ) 5 MG/0.5ML Pen] 6 mL 1    Sig: Inject 5 mg into the skin once a week.     Off-Protocol Failed - 10/17/2023 11:08 AM      Failed - Medication not assigned to a protocol, review manually.      Passed - Valid encounter within last 12 months    Recent Outpatient Visits           6 days ago Annual physical exam   [facility] Gareth Mliss FALCON, FNP   1 month ago Primary hypertension   [facility] Gareth Mliss FALCON, FNP   5 months ago Primary hypertension   [facility] Gareth Mliss FALCON, FNP       Future Appointments             In 1 year Gareth, Mliss FALCON, FNP [facility], [facility]

## 2023-10-21 ENCOUNTER — Other Ambulatory Visit: Payer: Self-pay

## 2023-10-21 ENCOUNTER — Encounter: Payer: Self-pay | Admitting: Emergency Medicine

## 2023-10-21 ENCOUNTER — Emergency Department
Admission: EM | Admit: 2023-10-21 | Discharge: 2023-10-21 | Disposition: A | Discharge status: Home / Self Care | Attending: Emergency Medicine | Admitting: Emergency Medicine

## 2023-10-21 ENCOUNTER — Encounter: Payer: Self-pay | Admitting: Nurse Practitioner

## 2023-10-21 ENCOUNTER — Emergency Department

## 2023-10-21 DIAGNOSIS — E119 Type 2 diabetes mellitus without complications: Secondary | ICD-10-CM | POA: Insufficient documentation

## 2023-10-21 DIAGNOSIS — R202 Paresthesia of skin: Secondary | ICD-10-CM

## 2023-10-21 DIAGNOSIS — R29810 Facial weakness: Secondary | ICD-10-CM | POA: Diagnosis not present

## 2023-10-21 DIAGNOSIS — I10 Essential (primary) hypertension: Secondary | ICD-10-CM | POA: Insufficient documentation

## 2023-10-21 DIAGNOSIS — G510 Bell's palsy: Secondary | ICD-10-CM | POA: Insufficient documentation

## 2023-10-21 DIAGNOSIS — J329 Chronic sinusitis, unspecified: Secondary | ICD-10-CM | POA: Diagnosis not present

## 2023-10-21 DIAGNOSIS — R200 Anesthesia of skin: Secondary | ICD-10-CM | POA: Diagnosis not present

## 2023-10-21 DIAGNOSIS — R519 Headache, unspecified: Secondary | ICD-10-CM | POA: Diagnosis not present

## 2023-10-21 LAB — COMPREHENSIVE METABOLIC PANEL WITH GFR
ALT: 24 U/L (ref 0–44)
AST: 23 U/L (ref 15–41)
Albumin: 4 g/dL (ref 3.5–5.0)
Alkaline Phosphatase: 52 U/L (ref 38–126)
Anion gap: 12 (ref 5–15)
BUN: 18 mg/dL (ref 6–20)
CO2: 21 mmol/L — ABNORMAL LOW (ref 22–32)
Calcium: 9.3 mg/dL (ref 8.9–10.3)
Chloride: 107 mmol/L (ref 98–111)
Creatinine, Ser: 0.96 mg/dL (ref 0.61–1.24)
GFR, Estimated: >60 mL/min (ref >60)
Glucose, Bld: 134 mg/dL — ABNORMAL HIGH (ref 70–99)
Potassium: 4.1 mmol/L (ref 3.5–5.1)
Sodium: 140 mmol/L (ref 135–145)
Total Bilirubin: 0.4 mg/dL (ref 0.0–1.2)
Total Protein: 6.5 g/dL (ref 6.5–8.1)

## 2023-10-21 LAB — DIFFERENTIAL
Abs Immature Granulocytes: 0.05 K/uL (ref 0.00–0.07)
Basophils Absolute: 0.2 K/uL — ABNORMAL HIGH (ref 0.0–0.1)
Basophils Relative: 1 %
Eosinophils Absolute: 0.7 K/uL — ABNORMAL HIGH (ref 0.0–0.5)
Eosinophils Relative: 5 %
Immature Granulocytes: 0 %
Lymphocytes Relative: 22 %
Lymphs Abs: 3.2 K/uL (ref 0.7–4.0)
Monocytes Absolute: 0.9 K/uL (ref 0.1–1.0)
Monocytes Relative: 6 %
Neutro Abs: 9.7 K/uL — ABNORMAL HIGH (ref 1.7–7.7)
Neutrophils Relative %: 66 %

## 2023-10-21 LAB — PROTIME-INR
INR: 1 (ref 0.8–1.2)
Prothrombin Time: 13.8 s (ref 11.4–15.2)

## 2023-10-21 LAB — CBC
HCT: 41.8 % (ref 39.0–52.0)
Hemoglobin: 14.4 g/dL (ref 13.0–17.0)
MCH: 29.6 pg (ref 26.0–34.0)
MCHC: 34.4 g/dL (ref 30.0–36.0)
MCV: 85.8 fL (ref 80.0–100.0)
Platelets: 276 K/uL (ref 150–400)
RBC: 4.87 MIL/uL (ref 4.22–5.81)
RDW: 13.9 % (ref 11.5–15.5)
WBC: 14.8 K/uL — ABNORMAL HIGH (ref 4.0–10.5)
nRBC: 0 % (ref 0.0–0.2)

## 2023-10-21 LAB — APTT: aPTT: 29 s (ref 24–36)

## 2023-10-21 MED ORDER — VALACYCLOVIR HCL 500 MG PO TABS
1000.0000 mg | ORAL_TABLET | Freq: Once | ORAL | Status: AC
  Administered 2023-10-21: 1000 mg via ORAL
  Filled 2023-10-21: qty 2

## 2023-10-21 MED ORDER — ACETAMINOPHEN 500 MG PO TABS
1000.0000 mg | ORAL_TABLET | Freq: Once | ORAL | Status: AC
  Administered 2023-10-21: 1000 mg via ORAL
  Filled 2023-10-21: qty 2

## 2023-10-21 MED ORDER — PREDNISONE 20 MG PO TABS
60.0000 mg | ORAL_TABLET | Freq: Every day | ORAL | 0 refills | Status: AC
  Filled 2023-10-21: qty 21, 7d supply, fill #0

## 2023-10-21 MED ORDER — VALACYCLOVIR HCL 1 G PO TABS
1000.0000 mg | ORAL_TABLET | Freq: Three times a day (TID) | ORAL | 0 refills | Status: AC
  Filled 2023-10-21: qty 21, 7d supply, fill #0

## 2023-10-21 MED ORDER — PREDNISONE 20 MG PO TABS
60.0000 mg | ORAL_TABLET | Freq: Once | ORAL | Status: AC
  Administered 2023-10-21: 60 mg via ORAL
  Filled 2023-10-21: qty 3

## 2023-10-21 NOTE — ED Provider Notes (Addendum)
 [facility] Provider Note    Event Date/Time   First MD Initiated Contact with Patient 10/21/23 0848     (approximate)   History   Numbness   HPI  Eric Carr is a 48 y.o. male with history of hypertension, diabetes, last A1c was 5.5, presenting with left upper and lower facial weakness and numbness.  States he noticed taste difference on his left as well.  Noticed some sharp shooting pain in his ear that has been intermittent for a while.  Also left-sided headache.  He denies any new trauma or falls.  States he notices symptoms on Saturday.  No prior history of strokes.  States that his diabetes have been well-controlled and he is off medications.  Has had hypertension in the past secondary to the metformin  and has been normotensive, was discontinued off those medications.  He denies any slurred speech, no weakness or numbness anywhere else to his body.  No difficulty walking.  On independent chart review, he was seen by his primary care provider in mid August, A1c was 5.6, currently on Mounjaro  5 mg weekly for diabetes.     Physical Exam   Triage Vital Signs: ED Triage Vitals  Encounter Vitals Group     BP 10/21/23 0840 117/71     Girls Systolic BP Percentile --      Girls Diastolic BP Percentile --      Boys Systolic BP Percentile --      Boys Diastolic BP Percentile --      Pulse Rate 10/21/23 0840 80     Resp 10/21/23 0840 17     Temp 10/21/23 0840 98 F (36.7 C)     Temp Source 10/21/23 0840 Oral     SpO2 10/21/23 0840 100 %     Weight --      Height --      Head Circumference --      Peak Flow --      Pain Score 10/21/23 0842 5     Pain Loc --      Pain Education --      Exclude from Growth Chart --     Most recent vital signs: Vitals:   10/21/23 0930 10/21/23 1049  BP: 130/70 (!) 148/74  Pulse: 80 77  Resp: (!) 22 18  Temp:    SpO2: 100% 100%     General: Awake, no distress.  CV:  Good peripheral perfusion.   Resp:  Normal effort.  No tachypnea or respiratory distress Abd:  No distention.  Nontender, soft Other:  Pupils are equal reactive, extraocular movements are intact, he has left upper and lower facial weakness compared to the right, also mildly decreased sensation to the left lower face.  TMs are clear bilaterally, no obvious rash or vesicular lesions.   ED Results / Procedures / Treatments   Labs (all labs ordered are listed, but only abnormal results are displayed) Labs Reviewed  CBC - Abnormal; Notable for the following components:      Result Value   WBC 14.8 (*)    All other components within normal limits  DIFFERENTIAL - Abnormal; Notable for the following components:   Neutro Abs 9.7 (*)    Eosinophils Absolute 0.7 (*)    Basophils Absolute 0.2 (*)    All other components within normal limits  COMPREHENSIVE METABOLIC PANEL WITH GFR - Abnormal; Notable for the following components:   CO2 21 (*)    Glucose, Bld 134 (*)  All other components within normal limits  PROTIME-INR  APTT     RADIOLOGY On my independent interpretation, MRI without obvious acute infarct   PROCEDURES:  Critical Care performed: No  Procedures   MEDICATIONS ORDERED IN ED: Medications  predniSONE  (DELTASONE ) tablet 60 mg (has no administration in time range)  valACYclovir  (VALTREX ) tablet 1,000 mg (has no administration in time range)  acetaminophen  (TYLENOL ) tablet 1,000 mg (1,000 mg Oral Given 10/21/23 0914)     IMPRESSION / MDM / ASSESSMENT AND PLAN / ED COURSE  I reviewed the triage vital signs and the nursing notes.                              Differential diagnosis includes, but is not limited to, Bell's palsy, CVA, electrolyte derangements, mass.  No stroke alert was activated given that he is outside the tPA window since his symptoms started on Saturday.  Labs were obtained out of triage.  Will get CT head and MRI.  Will give him some Tylenol .  Patient's presentation is most  consistent with acute presentation with potential threat to life or bodily function.  Independent interpretation of labs and imaging below.  Labs and MRI are reassuring, no evidence of acute stroke.  Will treat for Bell's palsy with prednisone  and Valtrex .  First dose given in the emergency department.  Eye precautions were given.  Instructed follow-up with primary care doctor next week to get reassessed.  Did also discussed with him about potential hyperglycemia in the setting of prednisone  use, he knows to check his glucose and to talk to his primary care doctor about any medication adjustments that need to happen if he is persistently hyperglycemic.  Also provided a number for him to call to follow-up with neurology in case he want to follow-up with them.  Considered but no indication for inpatient admission at this time, he safe for outpatient management.  Will discharge with strict return precautions.  The patient is on the cardiac monitor to evaluate for evidence of arrhythmia and/or significant heart rate changes.   Clinical Course as of 10/21/23 1058  Mon Oct 21, 2023  1054 MR BRAIN WO CONTRAST IMPRESSION: Normal noncontrast MRI appearance of the Brain.  Minor paranasal sinus inflammation, significance doubtful.   [TT]  1057 Independent review of labs, leukocytosis but this is nonspecific, electrolyte severely deranged, coags are normal. [TT]    Clinical Course User Index [TT] Waymond, Lorelle Cummins, MD     FINAL CLINICAL IMPRESSION(S) / ED DIAGNOSES   Final diagnoses:  Paresthesia  Bell's palsy     Rx / DC Orders   ED Discharge Orders          Ordered    predniSONE  (DELTASONE ) 20 MG tablet  Daily with breakfast        10/21/23 1056    valACYclovir  (VALTREX ) 1000 MG tablet  3 times daily        10/21/23 1056             Note:  This document was prepared using Dragon voice recognition software and may include unintentional dictation errors.    Waymond Lorelle Cummins,  MD 10/21/23 1058    Waymond Lorelle Cummins, MD 10/21/23 [phone]

## 2023-10-21 NOTE — Discharge Instructions (Signed)
 Please make sure to take your medications as prescribed for your Bell's palsy.  You can follow-up with your primary care doctor next week to get reassessed.  If you have worsening weakness, numbness, numbness to your arms or legs, room spinning dizziness, slurred speech, please return to the emergency department to be seen.  Because of your inability to close your left eye, you can use tape to close your eyes when you are sleeping to prevent it from drying out and getting scratched, you can also get over-the-counter lubricating eyedrops since your left eye could become dry.

## 2023-10-21 NOTE — ED Triage Notes (Addendum)
 Pt reports left sided facial numbness and having to use more force to move the left side of my face. Pt reports this started on Saturday. Denies any extremity weakness. Pt has drooping to the left side of the face. Pt also reports left sided headache.

## 2023-10-21 NOTE — ED Notes (Signed)
 Pt in MRI.

## 2023-10-21 NOTE — ED Notes (Signed)
 Pt in bed, pt denies pain, pt states that he is ready to go home, pt verbalized understanding d/c and follow up, advised to return for any concerns or worsening symptoms. Pt from department

## 2023-11-06 ENCOUNTER — Encounter: Payer: Self-pay | Admitting: Nurse Practitioner

## 2023-11-06 ENCOUNTER — Ambulatory Visit: Admitting: Nurse Practitioner

## 2023-11-06 VITALS — BP 120/62 | HR 88 | Temp 98.1°F | Resp 18 | Ht 69.0 in | Wt 166.8 lb

## 2023-11-06 DIAGNOSIS — E1165 Type 2 diabetes mellitus with hyperglycemia: Secondary | ICD-10-CM

## 2023-11-06 DIAGNOSIS — Z7985 Long-term (current) use of injectable non-insulin antidiabetic drugs: Secondary | ICD-10-CM

## 2023-11-06 DIAGNOSIS — G510 Bell's palsy: Secondary | ICD-10-CM

## 2023-11-06 NOTE — Assessment & Plan Note (Signed)
 Managed with mounjaro  5 mg weekly.  Will hold of on steroids due to increased blood sugar to 220 when on them.

## 2023-11-06 NOTE — Progress Notes (Signed)
 BP 120/62   Pulse 88   Temp 98.1 F (36.7 C)   Resp 18   Ht 5' 9 (1.753 m)   Wt 166 lb 12.8 oz (75.7 kg)   SpO2 99%   BMI 24.63 kg/m    Subjective:    Patient ID: Eric Carr, male    DOB: Oct 29, 1975, 48 y.o.   MRN: [id number]  HPI: Eric Carr is a 48 y.o. male presenting today for a ER follow-up and concern for a worsening headache in the back of his head. Patient seen in ER on 10/21/23 and diagnosed with Bell's Palsy affecting the left side of his face and an associated headache. Patient was prescribed prednisone  20 mg and valtrex  1000 mg 3 times daily x 1 week. Patient completed the course of medication and symptoms somewhat improved, however he hasn't been able to close left eye since being diagnosed. On Saturday he reported that his headache worsened again. Patient reports he has been taking BC powder for headache and states some relief. Patient wanting to know what further treatment can be done to help with symptoms, but reports he is not interested in doing another steroid due to history of type 2 diabetes mellitus with elevated blood sugar readings.    ER Imaging 10/21/23  MRI HEAD WITHOUT CONTRAST   TECHNIQUE: Multiplanar, multiecho pulse sequences of the brain and surrounding structures were obtained without intravenous contrast.   COMPARISON:  None.   FINDINGS: Brain:   Normal cerebral volume. No restricted diffusion to suggest acute infarction. No midline shift, mass effect, evidence of mass lesion, ventriculomegaly, extra-axial collection or acute intracranial hemorrhage. Cervicomedullary junction and pituitary are within normal limits.   Elnor and white matter signal is within normal limits throughout the brain. No encephalomalacia or chronic cerebral blood products identified. Deep gray nuclei, brainstem and cerebellum appear negative.   Vascular: Major intracranial vascular flow voids are preserved, distal left vertebral artery appears to be  dominant (normal variant).   Skull and upper cervical spine: Negative. Visualized bone marrow signal is within normal limits.   Sinuses/Orbits: Normal orbits. Minor paranasal sinus mucosal thickening, mostly on the left. No sinus fluid levels.   Other: Mastoids are clear. Visible internal auditory structures appear normal. Normal stylomastoid foramina. Negative visible scalp and face.   IMPRESSION: Normal noncontrast MRI appearance of the Brain.   Minor paranasal sinus inflammation, significance doubtful.  Medications Ordered:  Prednisone  20 mg tablet daily w/ breakfast  Valacyclovir  1000 mg tablet 3 times daily     10/11/2023    7:27 AM 08/20/2023    8:06 AM 04/30/2023    8:20 AM  Depression screen PHQ 2/9  Decreased Interest 0 0 0  Down, Depressed, Hopeless 0 0 0  PHQ - 2 Score 0 0 0  Altered sleeping  0   Tired, decreased energy  0   Change in appetite  0   Feeling bad or failure about yourself   0   Trouble concentrating  0   Moving slowly or fidgety/restless  0   Suicidal thoughts  0   PHQ-9 Score  0   Difficult doing work/chores  Not difficult at all     Relevant past medical, surgical, family and social history reviewed and updated as indicated. Interim medical history since our last visit reviewed. Allergies and medications reviewed and updated.  Review of Systems Constitutional: Negative for fever or weight change.  Respiratory: Negative for cough and shortness of breath.   Cardiovascular: Negative for chest  pain or palpitations.  Gastrointestinal: Negative for abdominal pain, no bowel changes.  Musculoskeletal: Negative for gait problem or joint swelling.  Skin: Negative for rash.  Neurological: Positive headache. Negative for dizziness.   No other specific complaints in a complete review of systems (except as listed in HPI above).      Objective:     BP 120/62   Pulse 88   Temp 98.1 F (36.7 C)   Resp 18   Ht 5' 9 (1.753 m)   Wt 166 lb 12.8 oz  (75.7 kg)   SpO2 99%   BMI 24.63 kg/m    Wt Readings from Last 3 Encounters:  11/06/23 166 lb 12.8 oz (75.7 kg)  10/11/23 163 lb 9.6 oz (74.2 kg)  08/20/23 157 lb (71.2 kg)    Physical Exam Constitutional:      Appearance: Normal appearance.  HENT:     Head: Normocephalic and atraumatic.     Comments: Slight facial droop on left sie  Eyes:     Comments: Unable to close left eye   Cardiovascular:     Rate and Rhythm: Normal rate and regular rhythm.     Pulses: Normal pulses.     Heart sounds: Normal heart sounds.  Pulmonary:     Effort: Pulmonary effort is normal.     Breath sounds: Normal breath sounds.  Musculoskeletal:        General: Normal range of motion.     Cervical back: Normal range of motion and neck supple.  Skin:    General: Skin is warm and dry.  Neurological:     General: No focal deficit present.     Mental Status: He is alert and oriented to person, place, and time.  Psychiatric:        Mood and Affect: Mood normal.        Behavior: Behavior normal.        Thought Content: Thought content normal.        Judgment: Judgment normal.      Results for orders placed or performed during the hospital encounter of 10/21/23  Protime-INR   Collection Time: 10/21/23  8:44 AM  Result Value Ref Range   Prothrombin Time 13.8 11.4 - 15.2 seconds   INR 1.0 0.8 - 1.2  APTT   Collection Time: 10/21/23  8:44 AM  Result Value Ref Range   aPTT 29 24 - 36 seconds  CBC   Collection Time: 10/21/23  8:44 AM  Result Value Ref Range   WBC 14.8 (H) 4.0 - 10.5 K/uL   RBC 4.87 4.22 - 5.81 MIL/uL   Hemoglobin 14.4 13.0 - 17.0 g/dL   HCT 58.1 60.9 - 47.9 %   MCV 85.8 80.0 - 100.0 fL   MCH 29.6 26.0 - 34.0 pg   MCHC 34.4 30.0 - 36.0 g/dL   RDW 86.0 88.4 - 84.4 %   Platelets 276 150 - 400 K/uL   nRBC 0.0 0.0 - 0.2 %  Differential   Collection Time: 10/21/23  8:44 AM  Result Value Ref Range   Neutrophils Relative % 66 %   Neutro Abs 9.7 (H) 1.7 - 7.7 K/uL    Lymphocytes Relative 22 %   Lymphs Abs 3.2 0.7 - 4.0 K/uL   Monocytes Relative 6 %   Monocytes Absolute 0.9 0.1 - 1.0 K/uL   Eosinophils Relative 5 %   Eosinophils Absolute 0.7 (H) 0.0 - 0.5 K/uL   Basophils Relative 1 %   Basophils Absolute  0.2 (H) 0.0 - 0.1 K/uL   Immature Granulocytes 0 %   Abs Immature Granulocytes 0.05 0.00 - 0.07 K/uL  Comprehensive metabolic panel   Collection Time: 10/21/23  8:44 AM  Result Value Ref Range   Sodium 140 135 - 145 mmol/L   Potassium 4.1 3.5 - 5.1 mmol/L   Chloride 107 98 - 111 mmol/L   CO2 21 (L) 22 - 32 mmol/L   Glucose, Bld 134 (H) 70 - 99 mg/dL   BUN 18 6 - 20 mg/dL   Creatinine, Ser 9.03 0.61 - 1.24 mg/dL   Calcium  9.3 8.9 - 10.3 mg/dL   Total Protein 6.5 6.5 - 8.1 g/dL   Albumin 4.0 3.5 - 5.0 g/dL   AST 23 15 - 41 U/L   ALT 24 0 - 44 U/L   Alkaline Phosphatase 52 38 - 126 U/L   Total Bilirubin 0.4 0.0 - 1.2 mg/dL   GFR, Estimated >39 >39 mL/min   Anion gap 12 5 - 15          Assessment & Plan:   Problem List Items Addressed This Visit       Endocrine   Diabetes (HCC)   Managed with mounjaro  5 mg weekly.  Will hold of on steroids due to increased blood sugar to 220 when on them.       Other Visit Diagnoses       Bell's palsy    -  Primary   Order placed for Neurology. Talked about Acetamiophen and Ibuprofen  for headache relief. Discussed not doing steroids at this time d/t T2DM.   Relevant Orders   Ambulatory referral to Neurology       -Acetaminophen /Ibuprofen  for headache relief as well as doing cool compresses to that area -Discussed the importance eye drops for left eye and taping eye at night -Educated about not doing steroids at this time due to concern for elevating blood glucose levels -Discussed Bells Palsy course and potential side effects  -Referral placed for Neurology          Follow up plan: Return if symptoms worsen or fail to improve.    I have reviewed this encounter including the  documentation in this note and/or discussed this patient with the provider, Aislinn Womack, SNP, I am certifying that I agree with the content of this note as supervising/preceptor nurse practitioner.  Mliss Spray, FNP-C [facility] 11/06/2023, 3:49 PM

## 2023-11-18 MED FILL — Glucose Blood Test Strip: 90 days supply | Qty: 100 | Fill #1 | Status: AC

## 2023-12-17 ENCOUNTER — Other Ambulatory Visit: Payer: Self-pay

## 2023-12-17 ENCOUNTER — Encounter: Payer: Self-pay | Admitting: Nurse Practitioner

## 2023-12-17 ENCOUNTER — Ambulatory Visit: Admitting: Nurse Practitioner

## 2023-12-17 VITALS — BP 110/68 | HR 82 | Temp 98.2°F | Resp 16 | Ht 69.0 in | Wt 162.2 lb

## 2023-12-17 DIAGNOSIS — I10 Essential (primary) hypertension: Secondary | ICD-10-CM | POA: Diagnosis not present

## 2023-12-17 DIAGNOSIS — E7849 Other hyperlipidemia: Secondary | ICD-10-CM

## 2023-12-17 DIAGNOSIS — E1169 Type 2 diabetes mellitus with other specified complication: Secondary | ICD-10-CM | POA: Diagnosis not present

## 2023-12-17 DIAGNOSIS — Z7985 Long-term (current) use of injectable non-insulin antidiabetic drugs: Secondary | ICD-10-CM | POA: Diagnosis not present

## 2023-12-17 LAB — POCT GLYCOSYLATED HEMOGLOBIN (HGB A1C): Hemoglobin A1C: 5.8 % — AB (ref 4.0–5.6)

## 2023-12-17 NOTE — Progress Notes (Signed)
 BP 110/68 (Cuff Size: Large)   Pulse 82   Temp 98.2 F (36.8 C) (Oral)   Resp 16   Ht 5' 9 (1.753 m)   Wt 162 lb 3.2 oz (73.6 kg)   SpO2 99%   BMI 23.95 kg/m    Subjective:    Patient ID: Eric Carr, male    DOB: [birth year], 48 y.o.   MRN: [id number]  HPI: Eric Carr is a 48 y.o. male  Chief Complaint  Patient presents with   Medical Management of Chronic Issues   Discussed the use of AI scribe software for clinical note transcription with the patient, who gave verbal consent to proceed.  History of Present Illness Eric Carr is a 48 year old male with hypertension, type two diabetes, and hyperlipidemia who presents for a routine follow-up.  Glycemic control - Type 2 diabetes mellitus managed with Mounjaro  5 mg weekly - Last hemoglobin A1c was 5.6%, now increased to 5.8% - Blood glucose readings approximately 5.2 mmol/L - Desires improved glycemic control   Noise-induced headaches since bells palsy  - Develops headaches triggered by loud noise exposure - Headaches described as similar to a 'bad hangover' if earplugs are not used - Wears earplugs at events to prevent headaches  Hyperlipidemia-controlled with diet Lipid Panel     Component Value Date/Time   CHOL 135 08/20/2023 0822   CHOL 118 11/17/2018 0952   TRIG 71 08/20/2023 0822   HDL 44 08/20/2023 0822   HDL 44 11/17/2018 0952   CHOLHDL 3.1 08/20/2023 0822   LDLCALC 76 08/20/2023 0822   LABVLDL 18 11/17/2018 0952            12/17/2023    7:37 AM 10/11/2023    7:27 AM 08/20/2023    8:06 AM  Depression screen PHQ 2/9  Decreased Interest 0 0 0  Down, Depressed, Hopeless 0 0 0  PHQ - 2 Score 0 0 0  Altered sleeping   0  Tired, decreased energy   0  Change in appetite   0  Feeling bad or failure about yourself    0  Trouble concentrating   0  Moving slowly or fidgety/restless   0  Suicidal thoughts   0  PHQ-9 Score   0  Difficult doing work/chores   Not difficult at all     Relevant past medical, surgical, family and social history reviewed and updated as indicated. Interim medical history since our last visit reviewed. Allergies and medications reviewed and updated.  Review of Systems  Constitutional: Negative for fever or weight change.  Respiratory: Negative for cough and shortness of breath.   Cardiovascular: Negative for chest pain or palpitations.  Gastrointestinal: Negative for abdominal pain, no bowel changes.  Musculoskeletal: Negative for gait problem or joint swelling.  Skin: Negative for rash.  Neurological: Negative for dizziness or headache.  No other specific complaints in a complete review of systems (except as listed in HPI above).      Objective:      BP 110/68 (Cuff Size: Large)   Pulse 82   Temp 98.2 F (36.8 C) (Oral)   Resp 16   Ht 5' 9 (1.753 m)   Wt 162 lb 3.2 oz (73.6 kg)   SpO2 99%   BMI 23.95 kg/m    Wt Readings from Last 3 Encounters:  12/17/23 162 lb 3.2 oz (73.6 kg)  11/06/23 166 lb 12.8 oz (75.7 kg)  10/11/23 163 lb 9.6 oz (74.2 kg)    Physical  Exam VITALS: P- 82, BP- 110/68 MEASUREMENTS: Weight- 162, BMI- 23.95. GENERAL: Alert, cooperative, well developed, no acute distress. HEENT: Normocephalic, normal oropharynx, moist mucous membranes. CHEST: Clear to auscultation bilaterally, no wheezes, rhonchi, or crackles. CARDIOVASCULAR: Normal heart rate and rhythm, S1 and S2 normal without murmurs. ABDOMEN: Soft, non-tender, non-distended, without organomegaly, normal bowel sounds. EXTREMITIES: No cyanosis or edema. NEUROLOGICAL: Cranial nerves grossly intact, moves all extremities without gross motor or sensory deficit.  Results for orders placed or performed during the hospital encounter of 10/21/23  Protime-INR   Collection Time: 10/21/23  8:44 AM  Result Value Ref Range   Prothrombin Time 13.8 11.4 - 15.2 seconds   INR 1.0 0.8 - 1.2  APTT   Collection Time: 10/21/23  8:44 AM  Result Value Ref Range    aPTT 29 24 - 36 seconds  CBC   Collection Time: 10/21/23  8:44 AM  Result Value Ref Range   WBC 14.8 (H) 4.0 - 10.5 K/uL   RBC 4.87 4.22 - 5.81 MIL/uL   Hemoglobin 14.4 13.0 - 17.0 g/dL   HCT 58.1 60.9 - 47.9 %   MCV 85.8 80.0 - 100.0 fL   MCH 29.6 26.0 - 34.0 pg   MCHC 34.4 30.0 - 36.0 g/dL   RDW 86.0 88.4 - 84.4 %   Platelets 276 150 - 400 K/uL   nRBC 0.0 0.0 - 0.2 %  Differential   Collection Time: 10/21/23  8:44 AM  Result Value Ref Range   Neutrophils Relative % 66 %   Neutro Abs 9.7 (H) 1.7 - 7.7 K/uL   Lymphocytes Relative 22 %   Lymphs Abs 3.2 0.7 - 4.0 K/uL   Monocytes Relative 6 %   Monocytes Absolute 0.9 0.1 - 1.0 K/uL   Eosinophils Relative 5 %   Eosinophils Absolute 0.7 (H) 0.0 - 0.5 K/uL   Basophils Relative 1 %   Basophils Absolute 0.2 (H) 0.0 - 0.1 K/uL   Immature Granulocytes 0 %   Abs Immature Granulocytes 0.05 0.00 - 0.07 K/uL  Comprehensive metabolic panel   Collection Time: 10/21/23  8:44 AM  Result Value Ref Range   Sodium 140 135 - 145 mmol/L   Potassium 4.1 3.5 - 5.1 mmol/L   Chloride 107 98 - 111 mmol/L   CO2 21 (L) 22 - 32 mmol/L   Glucose, Bld 134 (H) 70 - 99 mg/dL   BUN 18 6 - 20 mg/dL   Creatinine, Ser 9.03 0.61 - 1.24 mg/dL   Calcium  9.3 8.9 - 10.3 mg/dL   Total Protein 6.5 6.5 - 8.1 g/dL   Albumin 4.0 3.5 - 5.0 g/dL   AST 23 15 - 41 U/L   ALT 24 0 - 44 U/L   Alkaline Phosphatase 52 38 - 126 U/L   Total Bilirubin 0.4 0.0 - 1.2 mg/dL   GFR, Estimated >39 >39 mL/min   Anion gap 12 5 - 15          Assessment & Plan:   Problem List Items Addressed This Visit       Cardiovascular and Mediastinum   Hypertension - Primary     Endocrine   Diabetes (HCC)   Relevant Orders   POCT HgB A1C     Other   Other hyperlipidemia     Assessment and Plan Assessment & Plan Type 2 diabetes mellitus Well-controlled with an A1c of 5.8, slightly increased from the previous 5.6 but remains within target range. He is tolerating Mounjaro  5 mg  weekly well. - Continue Mounjaro  5 mg weekly - Scheduled follow-up in six months with a full panel of tests  Essential hypertension Well-controlled with a blood pressure reading of 110/68 mmHg. Not on blood pressure medication  Hyperlipidemia Well-managed with a recent normal lipid panel.        Follow up plan: Return in about 6 months (around 06/16/2024) for follow up.

## 2024-01-04 MED FILL — Tirzepatide Soln Auto-injector 5 MG/0.5ML: SUBCUTANEOUS | 84 days supply | Qty: 6 | Fill #1 | Status: AC

## 2024-02-04 DIAGNOSIS — H0288B Meibomian gland dysfunction left eye, upper and lower eyelids: Secondary | ICD-10-CM | POA: Diagnosis not present

## 2024-02-25 MED FILL — Glucose Blood Test Strip: 90 days supply | Qty: 100 | Fill #2 | Status: CN

## 2024-02-26 ENCOUNTER — Other Ambulatory Visit: Payer: Self-pay

## 2024-02-26 ENCOUNTER — Encounter: Payer: Self-pay | Admitting: Pharmacist

## 2024-03-02 ENCOUNTER — Other Ambulatory Visit: Payer: Self-pay

## 2024-03-02 MED FILL — Glucose Blood Test Strip: 90 days supply | Qty: 100 | Fill #2 | Status: AC

## 2024-03-31 ENCOUNTER — Encounter: Payer: Self-pay | Admitting: Nurse Practitioner

## 2024-06-16 ENCOUNTER — Ambulatory Visit: Admitting: Nurse Practitioner

## 2024-10-16 ENCOUNTER — Encounter: Admitting: Nurse Practitioner
